# Patient Record
Sex: Female | Born: 1955
Health system: Southern US, Community
[De-identification: ages and names within clinical notes are randomized; demographics above are authoritative.]

## PROBLEM LIST (undated history)

## (undated) DIAGNOSIS — E538 Deficiency of other specified B group vitamins: Secondary | ICD-10-CM

## (undated) DIAGNOSIS — K5790 Diverticulosis of intestine, part unspecified, without perforation or abscess without bleeding: Secondary | ICD-10-CM

## (undated) DIAGNOSIS — H269 Unspecified cataract: Secondary | ICD-10-CM

## (undated) DIAGNOSIS — F419 Anxiety disorder, unspecified: Secondary | ICD-10-CM

## (undated) DIAGNOSIS — C4491 Basal cell carcinoma of skin, unspecified: Secondary | ICD-10-CM

## (undated) DIAGNOSIS — T7840XA Allergy, unspecified, initial encounter: Secondary | ICD-10-CM

## (undated) DIAGNOSIS — R634 Abnormal weight loss: Secondary | ICD-10-CM

## (undated) DIAGNOSIS — K219 Gastro-esophageal reflux disease without esophagitis: Secondary | ICD-10-CM

## (undated) HISTORY — DX: Diverticulosis of intestine, part unspecified, without perforation or abscess without bleeding: K57.90

## (undated) HISTORY — DX: Basal cell carcinoma of skin, unspecified: C44.91

## (undated) HISTORY — DX: Deficiency of other specified B group vitamins: E53.8

## (undated) HISTORY — DX: Gastro-esophageal reflux disease without esophagitis: K21.9

## (undated) HISTORY — PX: MOHS SURGERY: SUR867

## (undated) HISTORY — DX: Anxiety disorder, unspecified: F41.9

## (undated) HISTORY — DX: Abnormal weight loss: R63.4

## (undated) HISTORY — DX: Unspecified cataract: H26.9

## (undated) HISTORY — DX: Allergy, unspecified, initial encounter: T78.40XA

---

## 1998-04-24 ENCOUNTER — Other Ambulatory Visit: Admission: RE | Admit: 1998-04-24 | Discharge: 1998-04-24 | Payer: Self-pay | Admitting: *Deleted

## 1998-04-24 ENCOUNTER — Other Ambulatory Visit: Admission: RE | Admit: 1998-04-24 | Discharge: 1998-04-24 | Payer: Self-pay | Admitting: Obstetrics and Gynecology

## 1999-06-27 ENCOUNTER — Other Ambulatory Visit: Admission: RE | Admit: 1999-06-27 | Discharge: 1999-06-27 | Payer: Self-pay | Admitting: *Deleted

## 1999-06-27 ENCOUNTER — Encounter (INDEPENDENT_AMBULATORY_CARE_PROVIDER_SITE_OTHER): Payer: Self-pay

## 2001-01-13 ENCOUNTER — Other Ambulatory Visit: Admission: RE | Admit: 2001-01-13 | Discharge: 2001-01-13 | Payer: Self-pay | Admitting: *Deleted

## 2002-07-06 ENCOUNTER — Other Ambulatory Visit: Admission: RE | Admit: 2002-07-06 | Discharge: 2002-07-06 | Payer: Self-pay | Admitting: Family Medicine

## 2003-05-17 ENCOUNTER — Encounter: Admission: RE | Admit: 2003-05-17 | Discharge: 2003-05-17 | Payer: Self-pay

## 2003-05-30 ENCOUNTER — Ambulatory Visit: Admission: RE | Admit: 2003-05-30 | Discharge: 2003-05-30 | Payer: Self-pay

## 2004-01-16 ENCOUNTER — Encounter: Admission: RE | Admit: 2004-01-16 | Discharge: 2004-01-16 | Payer: Self-pay | Admitting: Family Medicine

## 2004-01-16 ENCOUNTER — Encounter (INDEPENDENT_AMBULATORY_CARE_PROVIDER_SITE_OTHER): Payer: Self-pay | Admitting: *Deleted

## 2005-04-03 ENCOUNTER — Encounter: Admission: RE | Admit: 2005-04-03 | Discharge: 2005-04-03 | Payer: Self-pay | Admitting: Family Medicine

## 2005-04-16 ENCOUNTER — Other Ambulatory Visit: Admission: RE | Admit: 2005-04-16 | Discharge: 2005-04-16 | Payer: Self-pay | Admitting: Family Medicine

## 2007-03-19 ENCOUNTER — Encounter: Admission: RE | Admit: 2007-03-19 | Discharge: 2007-03-19 | Payer: Self-pay | Admitting: Family Medicine

## 2007-10-27 ENCOUNTER — Encounter: Admission: RE | Admit: 2007-10-27 | Discharge: 2007-10-27 | Payer: Self-pay | Admitting: Family Medicine

## 2008-07-17 ENCOUNTER — Ambulatory Visit: Payer: Self-pay | Admitting: Internal Medicine

## 2008-08-14 ENCOUNTER — Ambulatory Visit: Payer: Self-pay | Admitting: Internal Medicine

## 2010-01-07 ENCOUNTER — Encounter (INDEPENDENT_AMBULATORY_CARE_PROVIDER_SITE_OTHER): Payer: Self-pay | Admitting: *Deleted

## 2010-02-06 ENCOUNTER — Encounter: Admission: RE | Admit: 2010-02-06 | Discharge: 2010-02-06 | Payer: Self-pay | Admitting: Obstetrics and Gynecology

## 2010-02-27 ENCOUNTER — Ambulatory Visit: Payer: Self-pay | Admitting: Internal Medicine

## 2010-02-27 DIAGNOSIS — R634 Abnormal weight loss: Secondary | ICD-10-CM

## 2010-02-27 DIAGNOSIS — R1012 Left upper quadrant pain: Secondary | ICD-10-CM | POA: Insufficient documentation

## 2010-02-27 HISTORY — DX: Abnormal weight loss: R63.4

## 2010-02-27 HISTORY — DX: Left upper quadrant pain: R10.12

## 2010-02-27 LAB — CONVERTED CEMR LAB
ALT: 16 units/L (ref 0–35)
AST: 17 units/L (ref 0–37)
BUN: 15 mg/dL (ref 6–23)
CO2: 29 meq/L (ref 19–32)
Chloride: 106 meq/L (ref 96–112)
GFR calc non Af Amer: 133.82 mL/min (ref >60)
Glucose, Bld: 81 mg/dL (ref 70–99)
Vit D, 25-Hydroxy: 21 ng/mL — ABNORMAL LOW (ref 30–89)

## 2010-02-28 LAB — CONVERTED CEMR LAB: IgA: 193 mg/dL (ref 68–378)

## 2010-03-03 ENCOUNTER — Ambulatory Visit: Payer: Self-pay | Admitting: Internal Medicine

## 2010-03-03 DIAGNOSIS — E538 Deficiency of other specified B group vitamins: Secondary | ICD-10-CM | POA: Insufficient documentation

## 2010-04-01 ENCOUNTER — Ambulatory Visit: Payer: Self-pay | Admitting: Internal Medicine

## 2010-04-29 ENCOUNTER — Ambulatory Visit: Payer: Self-pay | Admitting: Internal Medicine

## 2010-05-27 ENCOUNTER — Ambulatory Visit: Payer: Self-pay | Admitting: Internal Medicine

## 2010-05-29 ENCOUNTER — Ambulatory Visit: Payer: Self-pay | Admitting: Family Medicine

## 2010-05-29 ENCOUNTER — Telehealth: Payer: Self-pay | Admitting: Family Medicine

## 2010-05-29 DIAGNOSIS — R1031 Right lower quadrant pain: Secondary | ICD-10-CM

## 2010-05-29 DIAGNOSIS — R079 Chest pain, unspecified: Secondary | ICD-10-CM | POA: Insufficient documentation

## 2010-05-29 HISTORY — DX: Chest pain, unspecified: R07.9

## 2010-05-29 HISTORY — DX: Right lower quadrant pain: R10.31

## 2010-06-27 ENCOUNTER — Ambulatory Visit: Payer: Self-pay | Admitting: Internal Medicine

## 2010-07-24 ENCOUNTER — Ambulatory Visit: Payer: Self-pay | Admitting: Internal Medicine

## 2010-07-24 DIAGNOSIS — E559 Vitamin D deficiency, unspecified: Secondary | ICD-10-CM | POA: Insufficient documentation

## 2010-08-20 ENCOUNTER — Ambulatory Visit: Payer: Self-pay | Admitting: Internal Medicine

## 2010-08-28 ENCOUNTER — Ambulatory Visit: Payer: Self-pay | Admitting: Internal Medicine

## 2010-09-01 ENCOUNTER — Ambulatory Visit: Payer: Self-pay | Admitting: Internal Medicine

## 2010-09-17 ENCOUNTER — Ambulatory Visit: Payer: Self-pay | Admitting: Internal Medicine

## 2010-09-19 ENCOUNTER — Telehealth: Payer: Self-pay | Admitting: Internal Medicine

## 2010-10-10 ENCOUNTER — Ambulatory Visit: Admit: 2010-10-10 | Payer: Self-pay | Admitting: Internal Medicine

## 2010-10-19 ENCOUNTER — Encounter: Payer: Self-pay | Admitting: Obstetrics and Gynecology

## 2010-10-26 LAB — CONVERTED CEMR LAB
ALT: 17 units/L (ref 0–35)
Alkaline Phosphatase: 37 units/L — ABNORMAL LOW (ref 39–117)
Basophils Relative: 0.8 % (ref 0.0–3.0)
Bilirubin, Direct: 0.1 mg/dL (ref 0.0–0.3)
Blood in Urine, dipstick: NEGATIVE
CO2: 29 meq/L (ref 19–32)
Chloride: 106 meq/L (ref 96–112)
Eosinophils Absolute: 0 10*3/uL (ref 0.0–0.7)
Eosinophils Relative: 1.2 % (ref 0.0–5.0)
Glucose, Urine, Semiquant: NEGATIVE
HDL: 84.1 mg/dL (ref >39.00)
Hemoglobin: 13.9 g/dL (ref 12.0–15.0)
Ketones, urine, test strip: NEGATIVE
Lymphs Abs: 1.5 10*3/uL (ref 0.7–4.0)
MCHC: 34.9 g/dL (ref 30.0–36.0)
Monocytes Relative: 7.3 % (ref 3.0–12.0)
Neutro Abs: 2.2 10*3/uL (ref 1.4–7.7)
RBC: 4.09 M/uL (ref 3.87–5.11)
RDW: 12.9 % (ref 11.5–14.6)
Sodium: 142 meq/L (ref 135–145)
Specific Gravity, Urine: <1.005
Total Bilirubin: 0.8 mg/dL (ref 0.3–1.2)
Total Protein: 6.4 g/dL (ref 6.0–8.3)
VLDL: 5 mg/dL (ref 0.0–40.0)

## 2010-10-28 NOTE — Assessment & Plan Note (Signed)
Summary: #2 of 6 monthly B12 inj/dfs  Nurse Visit   Allergies: No Known Drug Allergies  Medication Administration  Injection # 1:    Medication: Vit B12 1000 mcg    Diagnosis: B12 DEFICIENCY (ICD-266.2)    Route: IM    Site: R deltoid    Exp Date: 4/13    Lot #: [id number]    Mfr: American Regent    Patient tolerated injection without complications    Given by: Milford Cage NCMA (April 01, 2010 3:38 PM)  Orders Added: 1)  Vit B12 1000 mcg [J3420]

## 2010-10-28 NOTE — Miscellaneous (Signed)
Summary: Vitamin D Levels     ---- 07/24/2010 12:07 PM, Karen Kitchens Nelson-Smith CMA (AAMA) wrote: This Patient requests refills on Vitamin D..Marland KitchenMarland KitchenWe gave her an prescription in June but her vitamin d level was normal on 05/29/10....would you like me to continue giving her refills?   ---- 07/24/2010 12:18 PM, Hart Carwin MD wrote: it is still on the low side, so I would give her another course of Vit D, then recheck levels again.  Patient has been entered into [facility] system to have repeat labs around 10/10/10. Patient has been advised. Dottie Nelson-Smith CMA Duncan Dull)  July 24, 2010 2:09 PM   Problems: Added new problem of VITAMIN D DEFICIENCY (ICD-268.9) Medications: Added new medication of VITAMIN D (ERGOCALCIFEROL) 50000 UNIT CAPS (ERGOCALCIFEROL) Take 1 capsule by mouth once per week x 12 weeks, then have repeat labs completed. - Signed Rx of VITAMIN D (ERGOCALCIFEROL) 50000 UNIT CAPS (ERGOCALCIFEROL) Take 1 capsule by mouth once per week x 12 weeks, then have repeat labs completed.;  #12 x 0;  Signed;  Entered by: Lamona Curl CMA (AAMA);  Authorized by: Hart Carwin MD;  Method used: Faxed to [facility], [address] [place], [place], [place]  [address], Ph: [phone], Fax: [phone]    Prescriptions: VITAMIN D (ERGOCALCIFEROL) 50000 UNIT CAPS (ERGOCALCIFEROL) Take 1 capsule by mouth once per week x 12 weeks, then have repeat labs completed.  #12 x 0   Entered by:   Lamona Curl CMA (AAMA)   Authorized by:   Hart Carwin MD   Signed by:   Lamona Curl CMA (AAMA) on 07/24/2010   Method used:   Faxed to ...       [facility] (retail)       [phone]       [place], [place]  [address]       Ph: [phone]       Fax: [phone]   RxID:   [id number]

## 2010-10-28 NOTE — Progress Notes (Signed)
Summary: REFERRAL  ---- Converted from flag ---- ---- 05/29/2010 2:40 PM, Neena Rhymes MD wrote: treadmill only.  thanks!  ---- 05/29/2010 2:29 PM, Magdalen Spatz [facility] wrote: Dr. Beverely Low,  Just want to confirm which Treadmill you want, treadmill only, or Nuclear/Cardiolite Stress Test w/treadmill?  Thank you ------------------------------

## 2010-10-28 NOTE — Assessment & Plan Note (Signed)
Summary: NEW TO EST/CPX//PH   Vital Signs:  Patient profile:   55 year old female Height:      66 inches Weight:      114 pounds BMI:     18.47 Pulse rate:   66 / minute BP sitting:   100 / 60  (left arm)  Vitals Entered By: Doristine Devoid CMA (May 29, 2010 9:59 AM) CC: new est- CPX AND LABS   History of Present Illness: 55 yo woman here today to establish care.  previous MD- Riley Nearing.  GI- Brodie  GYN- Dickstein, UTD on pap and mammo.  Neuro- Sethi  chest pain- occured yesterday while walking, 'it stopped me in my tracks'.  had associated SOB.  sxs lasted < 1 minute.  has family hx of CAD.  has worn holter monitor in the past- showed 'abnormal rhythm' but pt isn't sure what it was called.  no CP or SOB today, after sxs resolved yesterday pt felt 'fine' the rest of the day.  'it felt like i needed to burp but couldn't'  Preventive Screening-Counseling & Management  Alcohol-Tobacco     Alcohol drinks/day: <1     Alcohol type: wine     Smoking Status: quit  Caffeine-Diet-Exercise     Does Patient Exercise: yes     Type of exercise: walking, yoga      Sexual History:  currently monogamous.        Drug Use:  never.    Current Medications (verified): 1)  Evamist 1.53 Mg/spray Soln (Estradiol) .Marland Kitchen.. 1 Spray Per Day 2)  Bentyl 10 Mg Caps (Dicyclomine Hcl) .... Take 1 Tablet By Mouth Two Times A Day  Allergies (verified): No Known Drug Allergies  Past History:  Past Medical History: Diverticulosis anxiety- hx of panic attack hx of GERD seasonal allergies basal cell skin cancer- Marylene Buerger  Past Surgical History: Moh's surgery  Family History: Family History of Stomach  aunt grandmother Family History of Colitis/Crohn's:mother Family History of Inflammatory Bowel Disease:mother Family History of Kidney Disease: Cancer Mother Lung Cancer- Dad CAD- mother, paternal grandfather Breast CA- none  Social History: smoked in college  Alcohol Use - yes 3 per week    Daily Caffeine Use2 cups day Illicit Drug Use - no Exercises regularly married, no children works as a Public relations account executive Smoking Status:  quit Does Patient Exercise:  yes Drug Use:  never Sexual History:  currently monogamous  Review of Systems       The patient complains of chest pain.  The patient denies anorexia, fever, weight loss, weight gain, vision loss, decreased hearing, hoarseness, syncope, dyspnea on exertion, peripheral edema, prolonged cough, headaches, abdominal pain, melena, hematochezia, severe indigestion/heartburn, hematuria, suspicious skin lesions, depression, abnormal bleeding, enlarged lymph nodes, and breast masses.         chest pain- see HPI hx of GERD- controlled per pt  Physical Exam  General:  Well developed, well nourished, no acute distress. Head:  Normocephalic and atraumatic without obvious abnormalities. No apparent alopecia or balding. Eyes:  No corneal or conjunctival inflammation noted. EOMI. Perrla. Funduscopic exam benign, without hemorrhages, exudates or papilledema. Vision grossly normal. Ears:  External ear exam shows no significant lesions or deformities.  Otoscopic examination reveals clear canals, tympanic membranes are intact bilaterally without bulging, retraction, inflammation or discharge. Hearing is grossly normal bilaterally. Nose:  External nasal examination shows no deformity or inflammation. Nasal mucosa are pink and moist without lesions or exudates. Mouth:  Oral mucosa and oropharynx without  lesions or exudates.  Teeth in good repair. Neck:  No deformities, masses, or tenderness noted. Breasts:  deferred to GYN Lungs:  Normal respiratory effort, chest expands symmetrically. Lungs are clear to auscultation, no crackles or wheezes. Heart:  Normal rate and regular rhythm. S1 and S2 normal without gallop, murmur, click, rub or other extra sounds. Abdomen:  soft, mild TTP in RLQ, no rebound/guarding Genitalia:  deferred to  gyn Pulses:  +2 carotid, radial, DP Extremities:  No clubbing, cyanosis, edema, or deformity noted with normal full range of motion of all joints.   Neurologic:  No cranial nerve deficits noted. Station and gait are normal. Plantar reflexes are down-going bilaterally. DTRs are symmetrical throughout. Sensory, motor and coordinative functions appear intact. Skin:  Intact without suspicious lesions or rashes Cervical Nodes:  No lymphadenopathy noted Axillary Nodes:  No palpable lymphadenopathy Psych:  Cognition and judgment appear intact. Alert and cooperative with normal attention span and concentration. No apparent delusions, illusions, hallucinations   Impression & Recommendations:  Problem # 1:  PHYSICAL EXAMINATION (ICD-V70.0) Assessment New pt's PE WNL.  UTD on health maintainence via multiple specialists.  check labs.  anticipatory guidance provided. Orders: T-Vitamin D (25-Hydroxy) [phone]) Specimen Handling ([id number]) UA Dipstick w/o Micro (manual) (81002)  Problem # 2:  CHEST PAIN (ICD-786.50) Assessment: New pt's episode of chest pain completely resolved, EKG w/out acute abnormality.  check labs.  get stress test to risk stratify and further evaluate.  differential dx includes cardiac, GERD, anxiety, exercise induced asthma.  reviewed supportive care and red flags that should prompt return.  Pt expresses understanding and is in agreement w/ this plan. Orders: Venipuncture (03474) Specimen Handling (25956) EKG w/ Interpretation (93000) Cardiology Referral (Cardiology) TLB-Lipid Panel (80061-LIPID) TLB-Hepatic/Liver Function Pnl (80076-HEPATIC) TLB-BMP (Basic Metabolic Panel-BMET) (80048-METABOL) TLB-TSH (Thyroid Stimulating Hormone) (84443-TSH) TLB-CBC Platelet - w/Differential (85025-CBCD)  Problem # 3:  ABDOMINAL PAIN, RIGHT LOWER QUADRANT (ICD-789.03) Assessment: New pt w/ mild TTP over RLQ.  no rebound or guarding.  pt not ill appearing, afebrile, vitals stable.  has  GI doctor.  reviewed supportive care and red flags that should prompt return.  Pt expresses understanding and is in agreement w/ this plan.  Complete Medication List: 1)  Evamist 1.53 Mg/spray Soln (Estradiol) .Marland Kitchen.. 1 spray per day 2)  Bentyl 10 Mg Caps (Dicyclomine hcl) .... Take 1 tablet by mouth two times a day 3)  Alprazolam 0.25 Mg Tabs (Alprazolam) .Marland Kitchen.. 1 tab by mouth as needed for anxiety 4)  Loratadine 10 Mg Tabs (Loratadine) .... Once daily as needed allergies 5)  Nasonex 50 Mcg/act Susp (Mometasone furoate) .... 2 sprays each nostril once daily 6)  Progesterone  .... As directed by gyn  Patient Instructions: 1)  Someone will call you with the appt for your stress test 2)  Please have your specialists send me copies of their notes 3)  We'll notify you of your lab results 4)  Call with any questions or concerns 5)  Welcome!  We're glad to have you! Prescriptions: LORATADINE 10 MG  TABS (LORATADINE) once daily as needed allergies  #30 x 12   Entered and Authorized by:   Neena Rhymes MD   Signed by:   Neena Rhymes MD on 05/29/2010   Method used:   Historical   RxID:   [id number] ALPRAZOLAM 0.25 MG TABS (ALPRAZOLAM) 1 tab by mouth as needed for anxiety  #30 x 1   Entered and Authorized by:   Neena Rhymes MD   Signed by:  Neena Rhymes MD on 05/29/2010   Method used:   Print then Give to Patient   RxID:   [id number]     Laboratory Results   Urine Tests   Date/Time Reported: May 29, 2010 11:04 AM   Routine Urinalysis   Color: yellow Appearance: Clear Glucose: negative   (Normal Range: Negative) Bilirubin: negative   (Normal Range: Negative) Ketone: negative   (Normal Range: Negative) Spec. Gravity: <1.005   (Normal Range: 1.003-1.035) Blood: negative   (Normal Range: Negative) pH: 5.0   (Normal Range: 5.0-8.0) Protein: negative   (Normal Range: Negative) Urobilinogen: negative   (Normal Range: 0-1) Nitrite: negative   (Normal  Range: Negative) Leukocyte Esterace: negative   (Normal Range: Negative)    Comments: Floydene Flock  May 29, 2010 11:05 AM

## 2010-10-28 NOTE — Assessment & Plan Note (Signed)
Summary: #1 OF 6.  Q MONTH x6, THEN RECHECK LABS.   [facility]  Nurse Visit   Allergies: No Known Drug Allergies  Medication Administration  Injection # 1:    Medication: Vit B12 1000 mcg    Diagnosis: B12 DEFICIENCY (ICD-266.2)    Route: IM    Site: L deltoid    Exp Date: 4/13    Lot #: [id number]    Mfr: [employer]ar    Patient tolerated injection without complications    Given by: Ashok Cordia RN (March 03, 2010 9:38 AM)  Orders Added: 1)  Vit B12 1000 mcg [J3420]

## 2010-10-28 NOTE — Assessment & Plan Note (Signed)
Summary: MONTHLY B12 SHOT/JMS  Nurse Visit   Medication Administration  Injection # 1:    Medication: Vit B12 1000 mcg    Diagnosis: B12 DEFICIENCY (ICD-266.2)    Route: IM    Site: R deltoid    Exp Date: 04/2012    Lot #: [id number]    Mfr: [employer]    Comments: PT WILL RETURN ON 09/17/10 FOR NEXT INJECTION    Patient tolerated injection without complications    Given by: Francee Piccolo CMA Duncan Dull) (August 20, 2010 10:22 AM)  Orders Added: 1)  Vit B12 1000 mcg [J3420]

## 2010-10-28 NOTE — Assessment & Plan Note (Signed)
Summary: F/U APPT...LSW.   History of Present Illness Visit Type: Follow-up Visit Primary GI MD: Lina Sar MD Primary [place]: Neena Rhymes MD Requesting Tahjanae Blankenburg: na Chief Complaint: Pt states one month ago had left side abd pain but she is feeling better now. Pt denies any other GI complaints  History of Present Illness:   This is a 55 year old white female with left upper quadrant abdominal discomfort. It bothered her last month for several weeks at a time and woke her up at night. It is a burning, constant sensation. The pain did not radiate to her back. She takes occasional anti-inflammatory agents. We have seen her in the past for abdominal pain and irritable bowel syndrome as well as for possible diverticulitis. Her mother has ulcerative colitis. Her tissue transglutaminase and stool for fat were negative. Her upper abdominal ultrasound in April 2005 showed a small liver cyst measuring 1.8, 1.5 and 1.2 cm. I saw her last year in June 2011. I suggested an upper endoscopy but  patient wanted to wait.   GI Review of Systems    Reports abdominal pain.     Location of  Abdominal pain: left side.    Denies acid reflux, belching, bloating, chest pain, dysphagia with liquids, dysphagia with solids, heartburn, loss of appetite, nausea, vomiting, vomiting blood, weight loss, and  weight gain.        Denies anal fissure, black tarry stools, change in bowel habit, constipation, diarrhea, diverticulosis, fecal incontinence, heme positive stool, hemorrhoids, irritable bowel syndrome, jaundice, light color stool, liver problems, rectal bleeding, and  rectal pain.    Current Medications (verified): 1)  Evamist 1.53 Mg/spray Soln (Estradiol) .Marland Kitchen.. 1 Spray Per Day 2)  Bentyl 10 Mg Caps (Dicyclomine Hcl) .... Take 1 Tablet By Mouth Two Times A Day As Needed 3)  Alprazolam 0.25 Mg Tabs (Alprazolam) .Marland Kitchen.. 1 Tab By Mouth As Needed For Anxiety 4)  Loratadine 10 Mg  Tabs (Loratadine) .... Once Daily As  Needed Allergies 5)  Nasonex 50 Mcg/act Susp (Mometasone Furoate) .... 2 Sprays Each Nostril Once Daily 6)  Progesterone .... As Directed By Gyn 7)  Vitamin D (Ergocalciferol) 50000 Unit Caps (Ergocalciferol) .... Take 1 Capsule By Mouth Once Per Week X 12 Weeks, Then Have Repeat Labs Completed.  Allergies (verified): No Known Drug Allergies  Past History:  Past Medical History: Diverticulosis anxiety- hx of panic attack hx of GERD seasonal allergies basal cell skin cancer- Marylene Buerger VITAMIN D DEFICIENCY (ICD-268.9) CHEST PAIN (ICD-786.50) ABDOMINAL PAIN, RIGHT LOWER QUADRANT (ICD-789.03) PHYSICAL EXAMINATION (ICD-V70.0) B12 DEFICIENCY (ICD-266.2) LOSS OF WEIGHT (ICD-783.21) ABDOMINAL PAIN, LEFT UPPER QUADRANT (ICD-789.02)  Past Surgical History: Reviewed history from 05/29/2010 and no changes required. Moh's surgery  Family History: Family History of Stomach  aunt grandmother Family History of Colitis/Crohn's:mother Family History of Inflammatory Bowel Disease:mother Family History of Kidney Disease: Cancer Mother Lung Cancer- Dad CAD- mother, paternal grandfather Breast CA- none No FH of Colon Cancer:  Social History: Reviewed history from 05/29/2010 and no changes required. 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  Review of Systems  The patient denies allergy/sinus, anemia, anxiety-new, arthritis/joint pain, back pain, blood in urine, breast changes/lumps, change in vision, confusion, cough, coughing up blood, depression-new, fainting, fatigue, fever, headaches-new, hearing problems, heart murmur, heart rhythm changes, itching, menstrual pain, muscle pains/cramps, night sweats, nosebleeds, pregnancy symptoms, shortness of  breath, skin rash, sleeping problems, sore throat, swelling of feet/legs, swollen lymph glands, thirst -  excessive , urination - excessive , urination changes/pain, urine leakage, vision changes, and voice change.         Pertinent positive and negative review of systems were noted in the above HPI. All other ROS was otherwise negative.   Vital Signs:  Patient profile:   55 year old female Height:      66 inches Weight:      118 pounds BMI:     19.11 BSA:     1.60 Pulse rate:   60 / minute Pulse rhythm:   regular BP sitting:   110 / 64  (left arm) Cuff size:   regular  Vitals Entered By: Ok Anis CMA (September 01, 2010 9:58 AM)  Physical Exam  General:  Well developed, well nourished, no acute distress. Eyes:  PERRLA, no icterus. Chest Wall:  mild tenderness of the left costal margin when pressing on the last 2 ribs. Lungs:  Clear throughout to auscultation. Heart:  Regular rate and rhythm; no murmurs, rubs,  or bruits. Abdomen:  soft relaxed abdomen with normoactive bowel sounds. No distention. Liver edge at costal margin. No scars. Mild discomfort in left upper quadrant. Extremities:  No clubbing, cyanosis, edema or deformities noted. Skin:  Intact without significant lesions or rashes. Psych:  Alert and cooperative. Normal mood and affect.   Impression & Recommendations:  Problem # 1:  VITAMIN D DEFICIENCY (ICD-268.9) We will recheck vitamin D levels today.  Orders: T-Vitamin D (25-Hydroxy) [phone]) TLB-B12, Serum-Total ONLY (08657-Q46) TLB-H. Pylori Abs(Helicobacter Pylori) (86677-HELICO)  Problem # 2:  B12 DEFICIENCY (ICD-266.2) We will recheck patient's vitamin B12 levels. if low, we will have to r/o PA on EGD.  Orders: T-Vitamin D (25-Hydroxy) [id number]) TLB-B12, Serum-Total ONLY (24401-U27) TLB-H. Pylori Abs(Helicobacter Pylori) (86677-HELICO)  Problem # 3:  LOSS OF WEIGHT (ICD-783.21) Patient's weight has stabilized. She has actually gained 4 pounds since last June.  Orders: T-Vitamin D (25-Hydroxy) [phone]) TLB-B12, Serum-Total ONLY  (74259-D63) TLB-H. Pylori Abs(Helicobacter Pylori) (86677-HELICO)  Problem # 4:  ABDOMINAL PAIN, LEFT UPPER QUADRANT (ICD-789.02) We need to rule out gastritis, H. pyloric gastropathy or NSAID related gastropathy. We will check an H. pylori antibody today. Patient will purchase Pepcid AC 20 mg one a day for 10 days. We will schedule her for an upper endoscopy. She wants to wait until January 2012.  Patient Instructions: 1)  We have given you several dates on which you can schedule your endoscopy. Please call our office and schedule as soon as you discuss with your husband. 2)  Take Pepcid 40 mg daily for 10 days. 3)  Your physician requests that you go to the basement floor of our office to have the following labwork completed before leaving today: Vitamin B12 level, Vitamin D level, H Pylori antibodies 4)  Copy sent to : Neena Rhymes, MD 5)  The medication list was reviewed and reconciled.  All changed / newly prescribed medications were explained.  A complete medication list was provided to the patient / caregiver.

## 2010-10-28 NOTE — Letter (Signed)
Summary: New Patient letter  [facility]  [address] [place], [place] [address]   Phone: [phone]  Fax: [phone]       01/07/2010 MRN: [id number]  [facility] [address] [place], [place]  [address]  Dear Ms. Cahoon,  Welcome to the Gastroenterology Division at [facility].    You are scheduled to see Dr. Lina Sar on February 27, 2010 at 9:15am on the 3rd floor at [facility], [address]  We ask that you try to arrive at our office 15 minutes prior to your appointment time to allow for check-in.  We would like you to complete the enclosed self-administered evaluation form prior to your visit and bring it with you on the day of your appointment.  We will review it with you.  Also, please bring a complete list of all your medications or, if you prefer, bring the medication bottles and we will list them.  Please bring your insurance card so that we may make a copy of it.  If your insurance requires a referral to see a specialist, please bring your referral form from your primary care physician.  Co-payments are due at the time of your visit and may be paid by cash, check or credit card.     Your office visit will consist of a consult with your physician (includes a physical exam), any laboratory testing he/she may order, scheduling of any necessary diagnostic testing (e.g. x-ray, ultrasound, CT-scan), and scheduling of a procedure (e.g. Endoscopy, Colonoscopy) if required.  Please allow enough time on your schedule to allow for any/all of these possibilities.    If you cannot keep your appointment, please call [phone] to cancel or reschedule prior to your appointment date.  This allows [place] the opportunity to schedule an appointment for another patient in need of care.  If you do not cancel or reschedule by 5 p.m. the business day prior to your appointment date, you will be charged a $50.00 late cancellation/no-show fee.    Thank you for choosing  [facility] for your medical needs.  We appreciate the opportunity to care for you.  Please visit [place] at our website  to learn more about our practice.                     Sincerely,                                                             The Gastroenterology Division

## 2010-10-28 NOTE — Assessment & Plan Note (Signed)
Summary: Monthly B12, 266.2  Nurse Visit   Allergies: No Known Drug Allergies  Medication Administration  Injection # 1:    Medication: Vit B12 1000 mcg    Diagnosis: B12 DEFICIENCY (ICD-266.2)    Route: IM    Site: R deltoid    Exp Date: 02/27/2012    Lot #: [id number]    Mfr: [employer]    Patient tolerated injection without complications    Given by: Christie Nottingham CMA (AAMA) (May 27, 2010 10:10 AM)  Orders Added: 1)  Vit B12 1000 mcg [J3420]

## 2010-10-28 NOTE — Assessment & Plan Note (Signed)
Summary: MONTHLY B12 SHOT...LSW.  Nurse Visit   Allergies: No Known Drug Allergies  Medication Administration  Injection # 1:    Medication: Vit B12 1000 mcg    Diagnosis: B12 DEFICIENCY (ICD-266.2)    Route: IM    Site: L deltoid    Exp Date: 03/28/2012    Lot #: [id number]    Mfr: [employer]    Comments: Made pt appt for next monthly B12 on 08-20-2010 @ 10:00 AM.     Patient tolerated injection without complications    Given by: Lowry Ram NCMA (July 25, 2010 8:41 AM)  Orders Added: 1)  Vit B12 1000 mcg [J3420]

## 2010-10-28 NOTE — Assessment & Plan Note (Signed)
Summary: MONTHLY B12 SHOT...LSW.  Nurse Visit   Allergies: No Known Drug Allergies  Medication Administration  Injection # 1:    Medication: Vit B12 1000 mcg    Diagnosis: B12 DEFICIENCY (ICD-266.2)    Route: IM    Site: L deltoid    Exp Date: 01/27/2012    Lot #: [id number]    Mfr: [employer]    Comments: Next Monthly B12 Injection appointment made on 05-27-10 @ 10:00 AM .    Patient tolerated injection without complications    Given by: Lowry Ram NCMA (April 29, 2010 10:09 AM)  Orders Added: 1)  Vit B12 1000 mcg [J3420]

## 2010-10-28 NOTE — Assessment & Plan Note (Signed)
Summary: left side pain, diverticulities...em   History of Present Illness Visit Type: Initial Visit Primary GI MD: Lina Sar MD Primary Provider: Bernadette Hoit MD Chief Complaint: left side abd pain History of Present Illness:   This is a 55 year old white female with complaints of loose stools, weight loss and left upper quadrant  abdominal pain. She had a screening colonoscopy in November 2009 which  showed diverticulosis and questionable diverticulitis. An abdominal ultrasound in May 2005 showed hepatic cysts and a common bile duct measuring 6.41mm. Her main complaint is fatigue and lack of energy. She works full time. Her usual weight is 125 pounds but her current weight is 114 pounds. There has been no nausea, vomiting, history of peptic ulcer disease or family history of colon cancer. Her mother apparently had ulcerative colitis. Her sprue profile was negative. She describes her stools as  soft and floating.   GI Review of Systems    Reports abdominal pain and  bloating.     Location of  Abdominal pain: left side.    Denies acid reflux, belching, chest pain, dysphagia with liquids, dysphagia with solids, heartburn, loss of appetite, nausea, vomiting, vomiting blood, weight loss, and  weight gain.      Reports diverticulosis.     Denies anal fissure, black tarry stools, change in bowel habit, constipation, diarrhea, fecal incontinence, heme positive stool, hemorrhoids, irritable bowel syndrome, jaundice, light color stool, liver problems, rectal bleeding, and  rectal pain.    Current Medications (verified): 1)  Evamist 1.53 Mg/spray Soln (Estradiol) .Marland Kitchen.. 1 Spray Per Day  Allergies (verified): No Known Drug Allergies  Past History:  Past Medical History: Diverticulosis  Past Surgical History: Unremarkable  Family History: Family History of Stomach  aunt grandmother Family History of Colitis/Crohn's:mother Family History of Inflammatory Bowel Disease:mother Family History  of Kidney Disease: Cancer Mother  Social History: Patient has never smoked.  Alcohol Use - yes 1 per week  Daily Caffeine Use2 cups day Illicit Drug Use - no Patient does not get regular exercise.  Smoking Status:  never Drug Use:  no Does Patient Exercise:  no  Review of Systems       The patient complains of allergy/sinus, change in vision, fatigue, heart rhythm changes, sleeping problems, and urine leakage.  The patient denies anemia, anxiety-new, arthritis/joint pain, back pain, blood in urine, breast changes/lumps, confusion, cough, coughing up blood, depression-new, fainting, fever, headaches-new, hearing problems, heart murmur, itching, menstrual pain, muscle pains/cramps, night sweats, nosebleeds, pregnancy symptoms, shortness of breath, skin rash, sore throat, swelling of feet/legs, swollen lymph glands, thirst - excessive , urination - excessive , urination changes/pain, vision changes, and voice change.         Pertinent positive and negative review of systems were noted in the above HPI. All other ROS was otherwise negative.   Vital Signs:  Patient profile:   55 year old female Height:      66 inches Weight:      114 pounds BMI:     18.47 Pulse rate:   70 / minute Pulse rhythm:   regular BP sitting:   100 / 60  (left arm)  Vitals Entered By: Chales Abrahams CMA Duncan Dull) (February 27, 2010 9:32 AM)  Physical Exam  General:  Well developed, well nourished, no acute distress. Eyes:  PERRLA, no icterus. Mouth:  No deformity or lesions, dentition normal. Neck:  Supple; no masses or thyromegaly. Lungs:  Clear throughout to auscultation. Heart:  Regular rate and rhythm; no  murmurs, rubs,  or bruits. Abdomen:  sabdomen with normoactive bowel sounds. No palpable mass no distention. There was a minimal tenderness in the right lower quadrant. Liver edge was at costal margin. Rectal:  soft Hemoccult negative stool. Extremities:  No clubbing, cyanosis, edema or deformities noted. Skin:   no rash. No stigmata of chronic liver disease. Inguinal Nodes:  no inguinal adenopathy. Psych:  Alert and cooperative. Normal mood and affect.   Impression & Recommendations:  Problem # 1:  LOSS OF WEIGHT (ICD-783.21) Patient has nonspecific weight loss which could be related to dietary restrictions. The patient went on a limited diet. She is perimenopausal and this may be also contributing to her fatigue. We need to evaluate her for steatorrhea with [place] stain., TSH level, serum carotene and IgA levels. We will also obtain iron studies and a B12 level, serum albumin. I doubt that she has malabsorption but we need to go ahead and evaluate that. She will also need a repeat upper abdominal ultrasound and most likely upper endoscopy with small bowel biopsies. She requested to wait with that. Orders: T-Vitamin D (25-Hydroxy) [id number]) T-Stool Fats [place] Stain [phone]) T-Beta Carotene [id number]) TLB-CMP (Comprehensive Metabolic Pnl) (80053-COMP) TLB-TSH (Thyroid Stimulating Hormone) (84443-TSH) TLB-IBC Pnl (Iron/FE;Transferrin) (83550-IBC) TLB-IgA (Immunoglobulin A) (82784-IGA) TLB-Sedimentation Rate (ESR) (85652-ESR)  Problem # 2:  ABDOMINAL PAIN, LEFT UPPER QUADRANT (ICD-789.02) Patient's left upper quadrant discomfort could be due to irritable bowel syndrome. We need to rule out gastritis or a functional GI problem. We will try her on Bentyl 20 mg p.o. b.i.d. Orders: T-Vitamin D (25-Hydroxy) [phone]) T-Stool Fats [place] Stain [id number]) T-Beta Carotene [phone]) TLB-CMP (Comprehensive Metabolic Pnl) (80053-COMP) TLB-TSH (Thyroid Stimulating Hormone) (84443-TSH) TLB-IBC Pnl (Iron/FE;Transferrin) (83550-IBC) TLB-IgA (Immunoglobulin A) (82784-IGA) TLB-Sedimentation Rate (ESR) (85652-ESR)  Other Orders: TLB-B12, Serum-Total ONLY ([id number])  Patient Instructions: 1)  Please go to the basment level for labwork today before leaving (stool for [place] stain, carotene,  TSH, IgA, Sedimentation rate, B12 level, IBC Panel, Vitamin D). 2)  Please pick up Bentyl at your pharmacy. You are to take 1 tablet twice daily. 3)  Copy sent to : Bernadette Hoit MD 4)  The medication list was reviewed and reconciled.  All changed / newly prescribed medications were explained.  A complete medication list was provided to the patient / caregiver. Prescriptions: BENTYL 10 MG CAPS (DICYCLOMINE HCL) Take 1 tablet by mouth two times a day  #60 x 2   Entered by:   Lamona Curl CMA (AAMA)   Authorized by:   Hart Carwin MD   Signed by:   Lamona Curl CMA (AAMA) on 02/27/2010   Method used:   Faxed to ...       [facility] (retail)       [phone] [address]       [place], [place]  [address]       Ph: [phone]       Fax: [phone]   RxID:   [id number]

## 2010-10-28 NOTE — Assessment & Plan Note (Signed)
Summary: MONTHLY B12 SHOT..AM  Nurse Visit   Allergies: No Known Drug Allergies  Medication Administration  Injection # 1:    Medication: Vit B12 1000 mcg    Diagnosis: B12 DEFICIENCY (ICD-266.2)    Route: IM    Site: L deltoid    Exp Date: 03/2012    Lot #: [id number]    Mfr: [employer]    Patient tolerated injection without complications    Given by: Harlow Mares CMA (AAMA) (June 27, 2010 10:15 AM)  Orders Added: 1)  Vit B12 1000 mcg [J3420]

## 2010-10-30 NOTE — Progress Notes (Signed)
Summary: No longer needs B12  ---- Converted from flag ----  ---- 09/17/2010 10:24 AM, Karen Kitchens Nelson-Smith CMA (AAMA) wrote: This patient came for b12 injection today. I want to make sure you would like her to continue b12 injections. Her last level was 532 on 09/01/10. Also, just an FYI, I did ask patient about endoscopy since she has not scheduled yet as recommended at her office visit. She states that she and her husband are still discussing whether or not she can go thru with it..... ------------------------------ ---- 09/17/2010 10:08 PM, Hart Carwin MD wrote: No further B12 injections needed. OK not to go ahead with EGD, but her abd. pain may go unexplained. It is her choice.   Phone Note Outgoing Call   Call placed by: Lamona Curl CMA Duncan Dull),  September 19, 2010 8:36 AM Call placed to: Patient Summary of Call: I have called to make patient aware that per Dr Juanda Chance, she no longer needs b12 injections at this time. Patient verbalizes understanding and I have cancelled her b12 appointment for 10/15/10. Initial call taken by: Lamona Curl CMA Duncan Dull),  September 19, 2010 8:37 AM

## 2010-10-30 NOTE — Assessment & Plan Note (Signed)
Summary: MONTHLY B12//266.2//SP  Nurse Visit   Allergies: No Known Drug Allergies  Medication Administration  Injection # 1:    Medication: Vit B12 1000 mcg    Diagnosis: B12 DEFICIENCY (ICD-266.2)    Route: IM    Site: L deltoid    Exp Date: 10/13    Lot #: [id number]    Mfr: [employer]    Patient tolerated injection without complications    Given by: Lamona Curl CMA (AAMA) (September 17, 2010 10:21 AM)  Orders Added: 1)  Vit B12 1000 mcg [J3420]  I have discussed with the patient the fact that she has not scheduled endoscopy as per Dr Regino Schultze request. Patient states that she and her husband are still discussing the matter. I have given her the number to billing as she has specific questions as to how much procedure will cost etc. Dottie Nelson-Smith CMA Duncan Dull)  September 17, 2010 10:22 AM

## 2010-12-12 ENCOUNTER — Ambulatory Visit (INDEPENDENT_AMBULATORY_CARE_PROVIDER_SITE_OTHER): Payer: 59 | Admitting: Family Medicine

## 2010-12-12 ENCOUNTER — Telehealth: Payer: Self-pay | Admitting: Family Medicine

## 2010-12-12 ENCOUNTER — Encounter: Payer: Self-pay | Admitting: Family Medicine

## 2010-12-12 DIAGNOSIS — K5289 Other specified noninfective gastroenteritis and colitis: Secondary | ICD-10-CM | POA: Insufficient documentation

## 2010-12-15 ENCOUNTER — Telehealth: Payer: Self-pay | Admitting: Family Medicine

## 2010-12-16 NOTE — Assessment & Plan Note (Signed)
Summary: vomiting,difficulty swallowing, diarrhea, refused ED//fd   Vital Signs:  Patient profile:   55 year old female Height:      66 inches (167.64 cm) Weight:      115.25 pounds (52.39 kg) BMI:     18.67 Temp:     98.0 degrees F (36.67 degrees C) oral BP sitting:   110 / 66  (right arm) Cuff size:   regular  Vitals Entered By: Lucious Groves CMA (December 12, 2010 9:56 AM) CC: Possible GI bug./kb Is Patient Diabetic? No Pain Assessment Patient in pain? no      Comments Patient notes that since last night she has been having chills, night sweats (but hasn't taken temperature), vomiting, diarrhea, dysphagia, and slight abd pain. Patient denies having any pain at this time.   History of Present Illness: 55 yo woman here today for GI virus.  reports she feels like her 'throat is closing'.  after each episode of vomiting 'it gets worse and worse'.  throat is burning, having difficulty swallowing.  no trouble breathing or speaking.  vomiting and diarrhea started last night.  + chills and sweats but no documented fever.  no known sick contacts.  no abd pain.  vomited x2, diarrhea x3  Current Medications (verified): 1)  Alprazolam 0.25 Mg Tabs (Alprazolam) .Marland Kitchen.. 1 Tab By Mouth As Needed For Anxiety 2)  Nasonex 50 Mcg/act Susp (Mometasone Furoate) .... 2 Sprays Each Nostril Once Daily  Allergies (verified): No Known Drug Allergies  Review of Systems      See HPI  Physical Exam  General:  Well developed, well nourished, no acute distress. Mouth:  MMM, mild erythema of posterior pharynx but no edema present Lungs:  Normal respiratory effort, chest expands symmetrically. Lungs are clear to auscultation, no crackles or wheezes. Heart:  Normal rate and regular rhythm. S1 and S2 normal without gallop, murmur, click, rub or other extra sounds. Abdomen:  soft, diffuse tenderness, no rebound/guarding, normoactive BS   Impression & Recommendations:  Problem # 1:  GASTROENTERITIS  (ICD-558.9) Assessment New pt's sxs w/ community acquired gastroenteritis.  no evidence of dehydration, no edema of throat.  start phenergan for nausea, immodium for diarrhea, advil for throat pain.  reviewed supportive care and red flags that should prompt return.  Pt expresses understanding and is in agreement w/ this plan.  Complete Medication List: 1)  Alprazolam 0.25 Mg Tabs (Alprazolam) .Marland Kitchen.. 1 tab by mouth as needed for anxiety 2)  Nasonex 50 Mcg/act Susp (Mometasone furoate) .... 2 sprays each nostril once daily 3)  Promethazine Hcl 25 Mg Tabs (Promethazine hcl) .Marland Kitchen.. 1 tab by mouth q6 as needed for nausea  Patient Instructions: 1)  This is most likely a viral illness and should improve w/ time 2)  Make sure you are drinking plenty of fluids!!  This is most important! 3)  Use the Promethazine as needed for nausea- this will make you sleepy 4)  Immodium as needed for the diarrhea- take 2 pills after your next stool and then wait at least 2 hrs before taking another (this will avoid constipation) 5)  Advil for the sore throat- either cold or warm liquids (whichever feels better) 6)  If anything changes or your symptoms worsen- please call! 7)  Hang in there!!! Prescriptions: PROMETHAZINE HCL 25 MG  TABS (PROMETHAZINE HCL) 1 tab by mouth Q6 as needed for nausea  #20 x 0   Entered and Authorized by:   Neena Rhymes MD   Signed by:  Neena Rhymes MD on 12/12/2010   Method used:   Electronically to        [facility] (retail)       [address]       [place], [place]  [address]       Ph: [phone] or [phone]       Fax: [phone]   RxID:   [id number]    Orders Added: 1)  Est. Patient Level III [34742]

## 2010-12-16 NOTE — Progress Notes (Signed)
Summary: Rosita Fire refused ED  Phone Note Call from Patient   Caller: Patient Summary of Call: Pt husband called stating that Pt has been vomiting all night and now throat is swelling  causing difficulty with swallowing. Pt husband also note that Pt has experience some episode of diarrhea. Advise Pt husband ED due to possible dehydration and throat swelling shut. Pt husband then states he does not understand " why if it is a emergency why she can't see her PCP" Pt husband advised that she can but with Pt symptoms we feel that they can be better address in ED. Pt husband still refused. Pt husband then changes Pt symptoms and states Pt is not dehydrated" I can feel her skin and tell that she is not' and her throat is not closing up she just can't swallow good. Pt coming in for OV this am................Marland KitchenFelecia Deloach CMA  December 12, 2010 8:22 AM   Follow-up for Phone Call        noted.  agree w/ initial recommendation of ER but will care for pt when she arrives Follow-up by: Neena Rhymes MD,  December 12, 2010 8:28 AM

## 2010-12-25 NOTE — Progress Notes (Signed)
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  #30, no refills Follow-up by: Neena Rhymes MD,  December 15, 2010 12:03 PM  Additional Follow-up for Phone Call Additional follow up Details #1::        Discuss with patient.........Marland KitchenFelecia Deloach CMA  December 15, 2010 1:28 PM     New/Updated Medications: ZOFRAN 4 MG TABS (ONDANSETRON HCL) 1 by mouth every 8 hours as needed for nausea Prescriptions: ZOFRAN 4 MG TABS (ONDANSETRON HCL) 1 by mouth every 8 hours as needed for nausea  #30 x 0   Entered and Authorized by:   Neena Rhymes MD   Signed by:   Neena Rhymes MD on 12/15/2010   Method used:   Electronically to        [facility] (retail)       [address]       [place], [place]  [address]       Ph: [phone] or [phone]       Fax: [phone]   RxID:   [id number]

## 2011-02-13 ENCOUNTER — Telehealth: Payer: Self-pay | Admitting: Family Medicine

## 2011-02-13 MED ORDER — MOMETASONE FUROATE 50 MCG/ACT NA SUSP: 2.0000 | Freq: Every day | NASAL | Status: DC

## 2011-02-13 NOTE — Telephone Encounter (Signed)
Patient didn't need rx at time of appt needs new rx for nasonex -[facility]

## 2011-02-13 NOTE — Telephone Encounter (Signed)
Refill sent and pt notified. 

## 2011-06-04 ENCOUNTER — Other Ambulatory Visit: Payer: Self-pay | Admitting: Family Medicine

## 2011-06-04 MED ORDER — ALPRAZOLAM 0.25 MG PO TABS: 0.2500 mg | ORAL_TABLET | Freq: Three times a day (TID) | ORAL | Status: DC | PRN

## 2011-06-04 NOTE — Telephone Encounter (Signed)
Ok for #30 but pt needs to schedule CPE.

## 2011-06-04 NOTE — Telephone Encounter (Signed)
DONE

## 2011-06-04 NOTE — Telephone Encounter (Signed)
Last CPX 05/29/10. Last script 05/29/10

## 2011-08-19 ENCOUNTER — Encounter: Payer: Self-pay | Admitting: Family Medicine

## 2011-08-27 ENCOUNTER — Encounter: Payer: Self-pay | Admitting: Family Medicine

## 2011-08-27 ENCOUNTER — Ambulatory Visit (INDEPENDENT_AMBULATORY_CARE_PROVIDER_SITE_OTHER): Payer: 59 | Admitting: Family Medicine

## 2011-08-27 DIAGNOSIS — R200 Anesthesia of skin: Secondary | ICD-10-CM

## 2011-08-27 DIAGNOSIS — E538 Deficiency of other specified B group vitamins: Secondary | ICD-10-CM

## 2011-08-27 DIAGNOSIS — M25519 Pain in unspecified shoulder: Secondary | ICD-10-CM

## 2011-08-27 DIAGNOSIS — R1013 Epigastric pain: Secondary | ICD-10-CM

## 2011-08-27 DIAGNOSIS — R209 Unspecified disturbances of skin sensation: Secondary | ICD-10-CM

## 2011-08-27 DIAGNOSIS — E559 Vitamin D deficiency, unspecified: Secondary | ICD-10-CM

## 2011-08-27 DIAGNOSIS — Z0000 Encounter for general adult medical examination without abnormal findings: Secondary | ICD-10-CM

## 2011-08-27 DIAGNOSIS — R202 Paresthesia of skin: Secondary | ICD-10-CM

## 2011-08-27 HISTORY — DX: Encounter for general adult medical examination without abnormal findings: Z00.00

## 2011-08-27 HISTORY — DX: Epigastric pain: R10.13

## 2011-08-27 HISTORY — DX: Pain in unspecified shoulder: M25.519

## 2011-08-27 HISTORY — DX: Anesthesia of skin: R20.0

## 2011-08-27 LAB — CBC WITH DIFFERENTIAL/PLATELET
Basophils Absolute: 0 10*3/uL (ref 0.0–0.1)
Basophils Relative: 0.5 % (ref 0.0–3.0)
Eosinophils Relative: 1.2 % (ref 0.0–5.0)
HCT: 42.3 % (ref 36.0–46.0)
Lymphocytes Relative: 31.4 % (ref 12.0–46.0)
Neutro Abs: 3.1 10*3/uL (ref 1.4–7.7)
Neutrophils Relative %: 59.3 % (ref 43.0–77.0)
RDW: 13.6 % (ref 11.5–14.6)

## 2011-08-27 LAB — LIPID PANEL: Cholesterol: 216 mg/dL — ABNORMAL HIGH (ref 0–200)

## 2011-08-27 LAB — BASIC METABOLIC PANEL
BUN: 11 mg/dL (ref 6–23)
CO2: 29 mEq/L (ref 19–32)
Chloride: 105 mEq/L (ref 96–112)

## 2011-08-27 LAB — HEPATIC FUNCTION PANEL
AST: 21 U/L (ref 0–37)
Alkaline Phosphatase: 42 U/L (ref 39–117)
Bilirubin, Direct: 0 mg/dL (ref 0.0–0.3)
Total Protein: 7.3 g/dL (ref 6.0–8.3)

## 2011-08-27 LAB — VITAMIN B12: Vitamin B-12: 380 pg/mL (ref 211–911)

## 2011-08-27 LAB — TSH: TSH: 2.61 u[IU]/mL (ref 0.35–5.50)

## 2011-08-27 LAB — AMYLASE: Amylase: 56 U/L (ref 27–131)

## 2011-08-27 MED ORDER — NAPROXEN 500 MG PO TABS: 500.0000 mg | ORAL_TABLET | Freq: Two times a day (BID) | ORAL | Status: AC

## 2011-08-27 NOTE — Progress Notes (Signed)
  Subjective:    Patient ID: Jaclyn Spencer, female    DOB: May 31, 1956, 55 y.o.   MRN: [id number]  HPI CPE- UTD on GYN ([facility]), UTD on colonoscopy 2009.  Epigastric pain- intermittent, sxs started again a few weeks ago, 'it's been pretty intense'.  Some associated nausea, no vomiting.  Some loose stools but no diarrhea.  No obvious relation to food but does worsen as day goes on.  No fevers.  Rare ETOH, 2 cups of tea daily.  L sided numbness- has appt w/ neuro Pearlean Brownie) pending in Jan.  Will occur intermittently.  Has been pain free for awhile and felt better when she was getting B12 shots.  L shoulder pain- pain is 'low level of pain', limited movement at times, 'like it catches'.  Will improve w/ Advil.  Review of Systems Patient reports no vision/ hearing changes, adenopathy,fever, weight change,  persistant/recurrent hoarseness , swallowing issues, chest pain, palpitations, edema, persistant/recurrent cough, hemoptysis, dyspnea (rest/exertional/paroxysmal nocturnal), gastrointestinal bleeding (melena, rectal bleeding), significant heartburn, bowel changes, GU symptoms (dysuria, hematuria, incontinence), Gyn symptoms (abnormal  bleeding, pain),  syncope, focal weakness, memory loss, skin/hair/nail changes, abnormal bruising or bleeding, anxiety, or depression.     Objective:   Physical Exam General Appearance:    Alert, cooperative, no distress, appears stated age  Head:    Normocephalic, without obvious abnormality, atraumatic  Eyes:    PERRL, conjunctiva/corneas clear, EOM's intact, fundi    benign, both eyes  Ears:    Normal TM's and external ear canals, both ears  Nose:   Nares normal, septum midline, mucosa normal, no drainage    or sinus tenderness  Throat:   Lips, mucosa, and tongue normal; teeth and gums normal  Neck:   Supple, symmetrical, trachea midline, no adenopathy;    Thyroid: no enlargement/tenderness/nodules  Back:     Symmetric, no curvature, ROM normal, no CVA tenderness    Lungs:     Clear to auscultation bilaterally, respirations unlabored  Chest Wall:    No tenderness or deformity   Heart:    Regular rate and rhythm, S1 and S2 normal, no murmur, rub   or gallop  Breast Exam:    Deferred to GYN  Abdomen:     Soft, mild tenderness over epigastrum, bowel sounds active all four quadrants,    no masses, no organomegaly  Genitalia:    Deferred to GYN  Rectal:    Extremities:   Extremities normal, atraumatic, no cyanosis or edema, L shoulder pain w/ internal rotation and impingement tests  Pulses:   2+ and symmetric all extremities  Skin:   Skin color, texture, turgor normal, no rashes or lesions  Lymph nodes:   Cervical, supraclavicular, and axillary nodes normal  Neurologic:   CNII-XII intact, normal strength, sensation and reflexes    throughout          Assessment & Plan:

## 2011-08-27 NOTE — Patient Instructions (Signed)
Follow up in 3 months to recheck abdominal pain, numbness/tingling We'll notify you of your lab results and set up B12 injections if needed Someone will call you with your sports med appt Take the Naproxen for the shoulder pain- take w/ food to avoid upset stomach Ice the shoulder as needed Have Dr Pearlean Brownie send me his results Call with any questions or concerns Happy Holidays!

## 2011-08-28 ENCOUNTER — Encounter: Payer: Self-pay | Admitting: *Deleted

## 2011-08-29 LAB — VITAMIN D 1,25 DIHYDROXY
Vitamin D2 1, 25 (OH)2: 29 pg/mL
Vitamin D3 1, 25 (OH)2: 37 pg/mL

## 2011-08-31 ENCOUNTER — Encounter: Payer: Self-pay | Admitting: *Deleted

## 2011-08-31 ENCOUNTER — Ambulatory Visit: Payer: 59 | Admitting: Family Medicine

## 2011-09-06 NOTE — Assessment & Plan Note (Signed)
Check labs and replete prn. 

## 2011-09-06 NOTE — Assessment & Plan Note (Signed)
Pt's PE WNL w/ exception of epigastric and shoulder pain.  UTD on health maintenance.  Check labs.  Anticipatory guidance provided.

## 2011-09-06 NOTE — Assessment & Plan Note (Signed)
Check labs and determine whether she again needs injxns.

## 2011-09-06 NOTE — Assessment & Plan Note (Signed)
sxs consistent w/ impingement.  Start NSAIDs.  Refer to sports med.

## 2011-09-06 NOTE — Assessment & Plan Note (Signed)
Has upcoming appt w/ neuro.  Currently sxs free.  Feels her sxs are better controlled when she gets B12 shots.  Will check labs.  Reviewed red flags that should prompt medical attention.

## 2011-09-06 NOTE — Assessment & Plan Note (Signed)
Unclear etiology.  Check labs to r/o infection/inflammation.  If sxs don't improve will refer to GI.  Pt expressed understanding and is in agreement w/ plan.

## 2011-09-07 ENCOUNTER — Ambulatory Visit (INDEPENDENT_AMBULATORY_CARE_PROVIDER_SITE_OTHER): Payer: 59 | Admitting: Family Medicine

## 2011-09-07 ENCOUNTER — Encounter: Payer: Self-pay | Admitting: Family Medicine

## 2011-09-07 VITALS — BP 133/94 | HR 98 | Temp 97.5°F | Ht 67.0 in | Wt 122.0 lb

## 2011-09-07 DIAGNOSIS — M25519 Pain in unspecified shoulder: Secondary | ICD-10-CM

## 2011-09-07 NOTE — Patient Instructions (Signed)
Your history and exam are consistent with biceps tendinopathy. Ice the area 15 minutes at a time as needed for pain. Tylenol 500mg  1-2 tabs three times a day as needed for pain. Aleve 2 tabs twice a day with food for pain and inflammation as needed. Avoid painful activities as much as possible. Do home exercises shown - 3 sets of 10 once a day. If you are struggling and want to do physical therapy, call me and we can order this for you. Otherwise follow-up with me as needed.

## 2011-09-08 ENCOUNTER — Encounter: Payer: Self-pay | Admitting: Family Medicine

## 2011-09-08 NOTE — Progress Notes (Signed)
Subjective:    Patient ID: Jaclyn Spencer, female    DOB: 26-Nov-1955, 55 y.o.   MRN: [id number]  PCP: Dr. Beverely Low  HPI 55 yo F here for left shoulder pain.  Patient denies known injury or trauma. States pain started about 3-4 months ago in anterior left shoulder. Pain worse with reaching activities, reaching behind her and up overhead. Is right handed. Pain is acute and only lasts seconds. No night pain. Has not taken any medications for this though recently prescribed naproxen. Pain worse lying on this shoulder.  Past Medical History  Diagnosis Date  . Diverticulosis   . Anxiety   . GERD (gastroesophageal reflux disease)   . Allergy     rhinitis  . Cancer     skin  . Vitamin D deficiency   . Chest pain   . Abdominal pain   . B12 deficiency   . Weight loss     Current Outpatient Prescriptions on File Prior to Visit  Medication Sig Dispense Refill  . ALPRAZolam (XANAX) 0.25 MG tablet Take 1 tablet (0.25 mg total) by mouth 3 (three) times daily as needed for anxiety.  30 tablet  0  . mometasone (NASONEX) 50 MCG/ACT nasal spray 2 sprays by Nasal route daily.  17 g  6  . naproxen (NAPROSYN) 500 MG tablet Take 1 tablet (500 mg total) by mouth 2 (two) times daily with a meal.  60 tablet  2    Past Surgical History  Procedure Date  . Mohs surgery     No Known Allergies  History   Social History  . Marital Status: Married    Spouse Name: N/A    Number of Children: N/A  . Years of Education: N/A   Occupational History  . Not on file.   Social History Main Topics  . Smoking status: Former Games developer  . Smokeless tobacco: Not on file  . Alcohol Use: Yes  . Drug Use: No  . Sexually Active: Not on file   Other Topics Concern  . Not on file   Social History Narrative  . No narrative on file    Family History  Problem Relation Age of Onset  . Colitis Mother     crohns  . Inflammatory bowel disease Mother   . Cancer Mother     cancer  . Coronary artery  disease Mother   . Heart attack Mother   . Cancer Father     lung  . Coronary artery disease Paternal Grandfather   . Cancer Other     stomach.Marland Kitchenaunt & grandmother  . Diabetes Neg Hx   . Hyperlipidemia Neg Hx   . Hypertension Neg Hx   . Sudden death Neg Hx     BP 133/94  Pulse 98  Temp(Src) 97.5 F (36.4 C) (Oral)  Ht 5\' 7"  (1.702 m)  Wt 122 lb (55.339 kg)  BMI 19.11 kg/m2  Review of Systems See HPI above.    Objective:   Physical Exam Gen: NAD  L shoulder: No swelling, ecchymoses.  No gross deformity. TTP at biceps tendon.  No TTP AC joint or elsewhere about shoulder. FROM without painful arc. Negative Hawkins, Neers. Positive speeds, equivocal yergasons. Negative crossover adduction. Negative Empty can and resisted internal/external rotation with 5/5 strength. Negative apprehension. NV intact distally.  R shoulder: FROM without pain or weakness.    Assessment & Plan:  1. Left shoulder pain - most consistent with biceps tendinopathy though her symptoms would suggest an element of  rotator cuff syndrome.  Start home exercise program with theraband, handout provided as well.  Tylenol, nsaids as needed for pain.  Icing as needed.  Advised if not improving to call me and would move forward with formal physical therapy.  Otherwise f/u prn.

## 2011-09-08 NOTE — Assessment & Plan Note (Signed)
most consistent with biceps tendinopathy though her symptoms would suggest an element of rotator cuff syndrome.  Start home exercise program with theraband, handout provided as well.  Tylenol, nsaids as needed for pain.  Icing as needed.  Advised if not improving to call me and would move forward with formal physical therapy.  Otherwise f/u prn.

## 2011-11-26 ENCOUNTER — Ambulatory Visit: Payer: 59 | Admitting: Family Medicine

## 2011-12-09 ENCOUNTER — Ambulatory Visit: Payer: 59 | Admitting: Family Medicine

## 2012-01-14 ENCOUNTER — Ambulatory Visit: Payer: 59 | Admitting: Family Medicine

## 2012-02-04 ENCOUNTER — Ambulatory Visit: Payer: 59 | Admitting: Family Medicine

## 2012-02-21 ENCOUNTER — Ambulatory Visit: Payer: 59 | Admitting: Family Medicine

## 2012-02-21 ENCOUNTER — Ambulatory Visit: Payer: Self-pay

## 2012-02-21 VITALS — BP 113/78 | HR 91 | Temp 98.0°F | Resp 16 | Ht 65.5 in | Wt 122.6 lb

## 2012-02-21 DIAGNOSIS — T148 Other injury of unspecified body region: Secondary | ICD-10-CM

## 2012-02-21 DIAGNOSIS — W57XXXA Bitten or stung by nonvenomous insect and other nonvenomous arthropods, initial encounter: Secondary | ICD-10-CM

## 2012-02-21 MED ORDER — DOXYCYCLINE HYCLATE 100 MG PO TABS: 100.0000 mg | ORAL_TABLET | Freq: Two times a day (BID) | ORAL | Status: AC

## 2012-02-21 NOTE — Progress Notes (Signed)
  Subjective:    Patient ID: Jaclyn Spencer, female    DOB: July 14, 1956, 56 y.o.   MRN: [id number]  HPI  Patient presents to clinic after removing a tick on Thurday. Now with streaky area from bite site to (L) groin  Concerned she may contract tularemia.  Denies fever or chills or other systemic symptoms Review of Systems     Objective:   Physical Exam  Constitutional: She appears well-developed.  Neck: Neck supple.  Cardiovascular: Normal rate, regular rhythm and normal heart sounds.   Pulmonary/Chest: Effort normal.  Abdominal: Soft. Bowel sounds are normal.  Skin:       Erythema around bite site. Entire tick removed. Slight streaking from bite to inguinal region.  No inguinal nodes palpable        Assessment & Plan:  Tick bite  Doxycycline 100 mg BID X 7 days Anticipatory guidance

## 2012-03-08 ENCOUNTER — Encounter: Payer: Self-pay | Admitting: Family Medicine

## 2012-03-08 ENCOUNTER — Ambulatory Visit (INDEPENDENT_AMBULATORY_CARE_PROVIDER_SITE_OTHER): Payer: 59 | Admitting: Family Medicine

## 2012-03-08 VITALS — BP 120/77 | HR 79 | Temp 98.1°F | Ht 66.25 in | Wt 125.4 lb

## 2012-03-08 DIAGNOSIS — H6980 Other specified disorders of Eustachian tube, unspecified ear: Secondary | ICD-10-CM | POA: Insufficient documentation

## 2012-03-08 DIAGNOSIS — J302 Other seasonal allergic rhinitis: Secondary | ICD-10-CM | POA: Insufficient documentation

## 2012-03-08 DIAGNOSIS — T148 Other injury of unspecified body region: Secondary | ICD-10-CM

## 2012-03-08 DIAGNOSIS — T148XXA Other injury of unspecified body region, initial encounter: Secondary | ICD-10-CM

## 2012-03-08 DIAGNOSIS — W57XXXA Bitten or stung by nonvenomous insect and other nonvenomous arthropods, initial encounter: Secondary | ICD-10-CM | POA: Insufficient documentation

## 2012-03-08 DIAGNOSIS — H6990 Unspecified Eustachian tube disorder, unspecified ear: Secondary | ICD-10-CM | POA: Insufficient documentation

## 2012-03-08 DIAGNOSIS — J309 Allergic rhinitis, unspecified: Secondary | ICD-10-CM

## 2012-03-08 HISTORY — DX: Bitten or stung by nonvenomous insect and other nonvenomous arthropods, initial encounter: W57.XXXA

## 2012-03-08 HISTORY — DX: Unspecified eustachian tube disorder, unspecified ear: H69.90

## 2012-03-08 MED ORDER — CIPROFLOXACIN HCL 500 MG PO TABS: 500.0000 mg | ORAL_TABLET | Freq: Two times a day (BID) | ORAL | Status: AC

## 2012-03-08 MED ORDER — MOMETASONE FUROATE 50 MCG/ACT NA SUSP: 2.0000 | Freq: Every day | NASAL | Status: AC

## 2012-03-08 NOTE — Assessment & Plan Note (Signed)
New.  Pt had unusual rxn of redness and swelling.  Denies fevers, chills, rash, HAs, joint pains that would be consistent w/ RMSF or Lyme.  Check labs to r/o infxn.  tx prn.

## 2012-03-08 NOTE — Patient Instructions (Signed)
We'll notify you of your lab results Switch to Zyrtec daily Continue the Nasonex- 2 sprays each nostril daily Call with any questions or concerns Have a great summer!!

## 2012-03-08 NOTE — Assessment & Plan Note (Signed)
New.  Encouraged daily nasal spray use to decrease turbinate edema.  Pt expressed understanding and is in agreement w/ plan.

## 2012-03-08 NOTE — Progress Notes (Signed)
  Subjective:    Patient ID: Jaclyn Spencer, female    DOB: [birth year], 56 y.o.   MRN: [id number]  HPI Tick bite- seen at [facility] end of May, got bit on abdomen and had 'unusual rxn'.  Describes band of redness and swelling across abdomen.  No fevers.  Denies joint aches/pain.  + mild HAs.  No rash.  L ear fullness- hx of similar, sxs are intermittent.  + itching.  + PND, nasal congestion.  No ear drainage.  No fevers.  Intermittent sinus pressure.  Taking OTC antihistamine and Nasonex prn.   Review of Systems For ROS see HPI     Objective:   Physical Exam  Vitals reviewed. Constitutional: She appears well-developed and well-nourished. No distress.  HENT:  Head: Normocephalic and atraumatic.  Right Ear: Tympanic membrane normal.  Left Ear: Tympanic membrane normal.  Nose: Mucosal edema and rhinorrhea present. Right sinus exhibits no maxillary sinus tenderness and no frontal sinus tenderness. Left sinus exhibits no maxillary sinus tenderness and no frontal sinus tenderness.  Mouth/Throat: Mucous membranes are normal. Posterior oropharyngeal erythema (w/ PND) present.  Eyes: Conjunctivae and EOM are normal. Pupils are equal, round, and reactive to light.  Neck: Normal range of motion. Neck supple.  Cardiovascular: Normal rate, regular rhythm and normal heart sounds.   Pulmonary/Chest: Effort normal and breath sounds normal. No respiratory distress. She has no wheezes. She has no rales.  Lymphadenopathy:    She has no cervical adenopathy.  Skin: Skin is warm and dry. There is erythema (mild ring of erythema surrounding tick bite on L lower flank, no induration, fluctuance or drainage).          Assessment & Plan:

## 2012-03-08 NOTE — Assessment & Plan Note (Signed)
New.  Pt to switch from Claritin to Zyrtec for better symptom relief.  Daily nasal steroid use.  Pt expressed understanding and is in agreement w/ plan.

## 2012-03-09 LAB — ROCKY MTN SPOTTED FVR ABS PNL(IGG+IGM)
RMSF IgG: 0.79 IV
RMSF IgM: 0.11 IV

## 2012-03-10 LAB — B. BURGDORFI ANTIBODIES: B burgdorferi Ab IgG+IgM: 0.29 {ISR}

## 2012-09-27 ENCOUNTER — Encounter: Payer: Self-pay | Admitting: Family Medicine

## 2012-09-27 ENCOUNTER — Ambulatory Visit (INDEPENDENT_AMBULATORY_CARE_PROVIDER_SITE_OTHER): Payer: 59 | Admitting: Family Medicine

## 2012-09-27 VITALS — BP 106/68 | HR 84 | Temp 98.3°F | Ht 65.75 in | Wt 126.4 lb

## 2012-09-27 DIAGNOSIS — E538 Deficiency of other specified B group vitamins: Secondary | ICD-10-CM

## 2012-09-27 DIAGNOSIS — E559 Vitamin D deficiency, unspecified: Secondary | ICD-10-CM

## 2012-09-27 DIAGNOSIS — Z0000 Encounter for general adult medical examination without abnormal findings: Secondary | ICD-10-CM

## 2012-09-27 LAB — HEPATIC FUNCTION PANEL
ALT: 16 U/L (ref 0–35)
AST: 20 U/L (ref 0–37)
Albumin: 4 g/dL (ref 3.5–5.2)
Alkaline Phosphatase: 43 U/L (ref 39–117)
Bilirubin, Direct: 0 mg/dL (ref 0.0–0.3)
Total Bilirubin: 0.8 mg/dL (ref 0.3–1.2)
Total Protein: 7 g/dL (ref 6.0–8.3)

## 2012-09-27 LAB — POCT URINALYSIS DIPSTICK
Glucose, UA: NEGATIVE
Nitrite, UA: NEGATIVE
Urobilinogen, UA: 0.2

## 2012-09-27 LAB — LIPID PANEL
HDL: 83 mg/dL (ref >39.00)
Triglycerides: 62 mg/dL (ref 0.0–149.0)

## 2012-09-27 LAB — LDL CHOLESTEROL, DIRECT: Direct LDL: 117.5 mg/dL

## 2012-09-27 LAB — CBC WITH DIFFERENTIAL/PLATELET
Eosinophils Relative: 1.6 % (ref 0.0–5.0)
HCT: 39.8 % (ref 36.0–46.0)
Lymphs Abs: 1.8 10*3/uL (ref 0.7–4.0)
MCHC: 34 g/dL (ref 30.0–36.0)
MCV: 94.1 fl (ref 78.0–100.0)
Monocytes Absolute: 0.4 10*3/uL (ref 0.1–1.0)
Platelets: 222 10*3/uL (ref 150.0–400.0)
RDW: 13.4 % (ref 11.5–14.6)
WBC: 4.8 10*3/uL (ref 4.5–10.5)

## 2012-09-27 LAB — C-REACTIVE PROTEIN: CRP: <0.5 mg/dL (ref 0.5–20.0)

## 2012-09-27 LAB — BASIC METABOLIC PANEL
Calcium: 8.9 mg/dL (ref 8.4–10.5)
GFR: 105.8 mL/min (ref >60.00)
Glucose, Bld: 92 mg/dL (ref 70–99)
Sodium: 139 mEq/L (ref 135–145)

## 2012-09-27 LAB — TSH: TSH: 1.38 u[IU]/mL (ref 0.35–5.50)

## 2012-09-27 MED ORDER — ALPRAZOLAM 0.25 MG PO TABS: 0.2500 mg | ORAL_TABLET | Freq: Three times a day (TID) | ORAL | Status: DC | PRN

## 2012-09-27 MED ORDER — ESZOPICLONE 2 MG PO TABS: 2.0000 mg | ORAL_TABLET | Freq: Every day | ORAL | Status: DC

## 2012-09-27 NOTE — Addendum Note (Signed)
Addended by: Silvio Pate D on: 09/27/2012 04:19 PM   Modules accepted: Orders

## 2012-09-27 NOTE — Assessment & Plan Note (Signed)
Check labs.  Replete prn. 

## 2012-09-27 NOTE — Progress Notes (Signed)
  Subjective:    Patient ID: Jaclyn Spencer, female    DOB: Feb 02, 1956, 56 y.o.   MRN: [id number]  HPI CPE- UTD on colonoscopy, pap, due for mammo.  Pt would like CRP done as part of CPE.   Review of Systems Patient reports no vision/ hearing changes, adenopathy,fever, weight change,  persistant/recurrent hoarseness , swallowing issues, chest pain, palpitations, edema, persistant/recurrent cough, hemoptysis, dyspnea (rest/exertional/paroxysmal nocturnal), gastrointestinal bleeding (melena, rectal bleeding), abdominal pain, significant heartburn, bowel changes, GU symptoms (dysuria, hematuria, incontinence), Gyn symptoms (abnormal  bleeding, pain),  syncope, focal weakness, memory loss, numbness & tingling, skin/hair/nail changes, abnormal bruising or bleeding, anxiety, or depression.     Objective:   Physical Exam General Appearance:    Alert, cooperative, no distress, appears stated age  Head:    Normocephalic, without obvious abnormality, atraumatic  Eyes:    PERRL, conjunctiva/corneas clear, EOM's intact, fundi    benign, both eyes  Ears:    Normal TM's and external ear canals, both ears  Nose:   Nares normal, septum midline, mucosa normal, no drainage    or sinus tenderness  Throat:   Lips, mucosa, and tongue normal; teeth and gums normal  Neck:   Supple, symmetrical, trachea midline, no adenopathy;    Thyroid: no enlargement/tenderness/nodules  Back:     Symmetric, no curvature, ROM normal, no CVA tenderness  Lungs:     Clear to auscultation bilaterally, respirations unlabored  Chest Wall:    No tenderness or deformity   Heart:    Regular rate and rhythm, S1 and S2 normal, no murmur, rub   or gallop  Breast Exam:    Deferred to GYN  Abdomen:     Soft, non-tender, bowel sounds active all four quadrants,    no masses, no organomegaly  Genitalia:    Deferred to GYN  Rectal:    Extremities:   Extremities normal, atraumatic, no cyanosis or edema  Pulses:   2+ and symmetric all  extremities  Skin:   Skin color, texture, turgor normal, no rashes or lesions  Lymph nodes:   Cervical, supraclavicular, and axillary nodes normal  Neurologic:   CNII-XII intact, normal strength, sensation and reflexes    throughout          Assessment & Plan:

## 2012-09-27 NOTE — Assessment & Plan Note (Signed)
Pt's PE WNL.  UTD on health maintenance w/ exception of mammo- pt to schedule.  Check labs.  Anticipatory guidance provided.

## 2012-09-27 NOTE — Patient Instructions (Addendum)
You look great!  Keep up the good work! We'll notify you of your lab results and make any changes if needed Call with any questions or concerns Schedule your mammo at your convenience Happy [place] Ouch! Happy New Year!!

## 2012-10-01 LAB — VITAMIN D 1,25 DIHYDROXY: Vitamin D 1, 25 (OH)2 Total: 55 pg/mL (ref 18–72)

## 2012-10-18 ENCOUNTER — Encounter: Payer: Self-pay | Admitting: Family Medicine

## 2012-10-20 ENCOUNTER — Other Ambulatory Visit: Payer: Self-pay | Admitting: *Deleted

## 2012-10-20 DIAGNOSIS — G4700 Insomnia, unspecified: Secondary | ICD-10-CM

## 2012-10-20 MED ORDER — ZOLPIDEM TARTRATE 10 MG PO TABS: 10.0000 mg | ORAL_TABLET | Freq: Every evening | ORAL | Status: DC | PRN

## 2012-10-20 NOTE — Telephone Encounter (Signed)
Rx sent 

## 2012-10-21 ENCOUNTER — Telehealth: Payer: Self-pay | Admitting: *Deleted

## 2012-10-21 NOTE — Telephone Encounter (Signed)
Generic Ambien was sent yesterday

## 2012-10-21 NOTE — Telephone Encounter (Signed)
Patient called triage, states lunesta too expensive wants to try something else. Pt already has medication agreement on file, please advise.

## 2012-11-16 ENCOUNTER — Telehealth: Payer: Self-pay | Admitting: Family Medicine

## 2012-11-16 DIAGNOSIS — G4700 Insomnia, unspecified: Secondary | ICD-10-CM

## 2012-11-16 MED ORDER — ZOLPIDEM TARTRATE 10 MG PO TABS: ORAL_TABLET | ORAL | Status: DC

## 2012-11-16 NOTE — Telephone Encounter (Signed)
[facility]ed [facility] who states that Rx was never received on 10-20-12, Rx sent [facility] Pt aware via VM Rx sent

## 2012-11-16 NOTE — Telephone Encounter (Signed)
Patient called regarding her request in January to change from Lunesta to something less expensive. She never received a call back. I advised pt that we changed her medication to zolpidem and she states she would like this sent to [facility] pharmacy. She was not aware we changed her rx and did not go to [facility] to pick up zolpidem.

## 2013-07-06 ENCOUNTER — Other Ambulatory Visit: Payer: Self-pay | Admitting: Family Medicine

## 2013-07-06 NOTE — Telephone Encounter (Signed)
Med filled. Letter mailed to pt to make a CPE appt.

## 2013-08-03 ENCOUNTER — Other Ambulatory Visit: Payer: Self-pay

## 2013-09-12 ENCOUNTER — Telehealth: Payer: Self-pay | Admitting: *Deleted

## 2013-09-12 ENCOUNTER — Telehealth: Payer: Self-pay | Admitting: General Practice

## 2013-09-12 NOTE — Telephone Encounter (Signed)
Last OV 09-27-12 Xanax filled same day #30 with 1  Csc on file

## 2013-09-12 NOTE — Telephone Encounter (Signed)
Last seen-09/27/2012  Last filled-09/27/2012 #30, 1 refill   UDS-not on file, contract signe d  Please advise. SW

## 2013-09-12 NOTE — Telephone Encounter (Signed)
Ok for #30, 1 refill- please have pt schedule CPE b/c she will be due on 12/31

## 2013-09-12 NOTE — Telephone Encounter (Signed)
Med called in. Left patient a message stating that she needs to schedule an OV

## 2013-09-13 NOTE — Telephone Encounter (Signed)
Already filled on 12/16

## 2013-09-13 NOTE — Telephone Encounter (Signed)
Noted  

## 2014-06-18 ENCOUNTER — Other Ambulatory Visit: Payer: Self-pay

## 2014-06-18 DIAGNOSIS — Z1231 Encounter for screening mammogram for malignant neoplasm of breast: Secondary | ICD-10-CM

## 2014-06-25 ENCOUNTER — Other Ambulatory Visit: Payer: Self-pay | Admitting: Family Medicine

## 2014-06-25 DIAGNOSIS — M810 Age-related osteoporosis without current pathological fracture: Secondary | ICD-10-CM

## 2014-06-27 DIAGNOSIS — M810 Age-related osteoporosis without current pathological fracture: Secondary | ICD-10-CM | POA: Insufficient documentation

## 2014-06-28 ENCOUNTER — Ambulatory Visit
Admission: RE | Admit: 2014-06-28 | Discharge: 2014-06-28 | Disposition: A | Discharge status: Home / Self Care | Payer: BC Managed Care – PPO | Source: Ambulatory Visit

## 2014-06-28 DIAGNOSIS — Z1231 Encounter for screening mammogram for malignant neoplasm of breast: Secondary | ICD-10-CM

## 2014-07-09 ENCOUNTER — Other Ambulatory Visit: Payer: Self-pay | Admitting: Family Medicine

## 2014-07-09 ENCOUNTER — Ambulatory Visit
Admission: RE | Admit: 2014-07-09 | Discharge: 2014-07-09 | Disposition: A | Discharge status: Home / Self Care | Payer: BC Managed Care – PPO | Source: Ambulatory Visit | Attending: Family Medicine | Admitting: Family Medicine

## 2014-07-09 DIAGNOSIS — M8580 Other specified disorders of bone density and structure, unspecified site: Secondary | ICD-10-CM

## 2014-07-09 DIAGNOSIS — M810 Age-related osteoporosis without current pathological fracture: Secondary | ICD-10-CM

## 2015-02-15 ENCOUNTER — Encounter: Payer: Self-pay | Admitting: Internal Medicine

## 2016-06-30 DIAGNOSIS — K5901 Slow transit constipation: Secondary | ICD-10-CM | POA: Insufficient documentation

## 2016-07-16 DIAGNOSIS — Z1211 Encounter for screening for malignant neoplasm of colon: Secondary | ICD-10-CM | POA: Insufficient documentation

## 2016-07-16 DIAGNOSIS — G4700 Insomnia, unspecified: Secondary | ICD-10-CM

## 2016-07-16 HISTORY — DX: Encounter for screening for malignant neoplasm of colon: Z12.11

## 2016-07-16 HISTORY — DX: Insomnia, unspecified: G47.00

## 2016-08-12 ENCOUNTER — Ambulatory Visit (INDEPENDENT_AMBULATORY_CARE_PROVIDER_SITE_OTHER): Payer: BLUE CROSS/BLUE SHIELD | Admitting: Gastroenterology

## 2016-08-12 ENCOUNTER — Encounter: Payer: Self-pay | Admitting: Gastroenterology

## 2016-08-12 VITALS — BP 118/76 | HR 84 | Ht 65.5 in | Wt 128.1 lb

## 2016-08-12 DIAGNOSIS — K6289 Other specified diseases of anus and rectum: Secondary | ICD-10-CM

## 2016-08-12 DIAGNOSIS — K5900 Constipation, unspecified: Secondary | ICD-10-CM

## 2016-08-12 DIAGNOSIS — K602 Anal fissure, unspecified: Secondary | ICD-10-CM | POA: Diagnosis not present

## 2016-08-12 MED ORDER — AMBULATORY NON FORMULARY MEDICATION: 3 refills | Status: DC

## 2016-08-12 NOTE — Patient Instructions (Addendum)
Use Benefiber 1 tablespoon three times a day with meals  We have given you a printed prescription for you to take to [facility]  Follow up 10/14/2016 at 10:30am

## 2016-08-12 NOTE — Progress Notes (Signed)
Jaclyn Spencer    [id number]    1956/03/16  Primary Care 49 M., MD  Referring Physician: No referring provider defined for this encounter.  Chief complaint:  Rectal pain, constipation  HPI: 60 year old female here for new patient evaluation with complaints of rectal pain for past few weeks. She was remotely followed by Dr. Olevia Perches, as seen in December 2011 She had worsening of baseline constipation with hard stool last month , since then has been having pain in the anal area after a bowel movement , does improve with ibuprofen . Denies any blood per rectum . She has also noticed worsening rectal pain when she sits for a prolonged period or tries to bend. Constipation has improved with dietary changes after she stopped eating gluten and carbohydrates Denies any nausea, vomiting, abdominal pain, melena or bright red blood per rectum Colonoscopy November 2009 showed sigmoid diverticulosis and internal hemorrhoids otherwise normal   Outpatient Encounter Prescriptions as of 08/12/2016  Medication Sig  . NASONEX 50 MCG/ACT nasal spray Place 2 sprays into the nose daily.  . [DISCONTINUED] eszopiclone (LUNESTA) 2 MG TABS Take 1 tablet (2 mg total) by mouth at bedtime. Take immediately before bedtime (Patient not taking: Reported on 08/12/2016)  . [DISCONTINUED] zolpidem (AMBIEN) 10 MG tablet Take 1/2 to 1 tab at bedtime as needed for sleep (Patient not taking: Reported on 08/12/2016)   No facility-administered encounter medications on file as of 08/12/2016.     Allergies as of 08/12/2016  . (No Known Allergies)    Past Medical History:  Diagnosis Date  . Abdominal pain   . Allergy    rhinitis  . Anxiety   . B12 deficiency   . Basal cell carcinoma   . Chest pain   . Diverticulosis   . GERD (gastroesophageal reflux disease)   . Vitamin D deficiency   . Weight loss     Past Surgical History:  Procedure Laterality Date  . MOHS SURGERY     nose      Family History  Problem Relation Age of Onset  . Colitis Mother   . Inflammatory bowel disease Mother   . Coronary artery disease Mother   . Heart attack Mother   . Kidney cancer Mother   . Lung cancer Father   . Coronary artery disease Paternal Grandfather   . Stomach cancer Paternal Grandmother   . Stomach cancer Paternal Aunt   . Diabetes Neg Hx   . Hyperlipidemia Neg Hx   . Hypertension Neg Hx   . Sudden death Neg Hx     Social History   Social History  . Marital status: Married    Spouse name: N/A  . Number of children: 0  . Years of education: N/A   Occupational History  . Not on file.   Social History Main Topics  . Smoking status: Former Smoker    Quit date: 02/20/1977  . Smokeless tobacco: Never Used  . Alcohol use Yes     Comment: 1 per day  . Drug use: No  . Sexual activity: Not on file   Other Topics Concern  . Not on file   Social History Narrative  . No narrative on file      Review of systems: Review of Systems  Constitutional: Negative for fever and chills.  HENT: Negative.   Eyes: Negative for blurred vision.  Respiratory: Negative for cough, shortness of breath and wheezing.   Cardiovascular: Negative  for chest pain and palpitations.  Gastrointestinal: as per HPI Genitourinary: Negative for dysuria, urgency, frequency and hematuria.  Musculoskeletal: Negative for myalgias, back pain and joint pain.  Skin: Negative for itching and rash.  Neurological: Negative for dizziness, tremors, focal weakness, seizures and loss of consciousness.  Endo/Heme/Allergies: Positive for seasonal allergies.  Psychiatric/Behavioral: Negative for depression, suicidal ideas and hallucinations.  All other systems reviewed and are negative.   Physical Exam: Vitals:   08/12/16 0908  BP: 118/76  Pulse: 84   Body mass index is 21 kg/m. Gen:      No acute distress HEENT:  EOMI, sclera anicteric Neck:     No masses; no thyromegaly Lungs:    Clear to  auscultation bilaterally; normal respiratory effort CV:         Regular rate and rhythm; no murmurs Abd:      + bowel sounds; soft, non-tender; no palpable masses, no distension Ext:    No edema; adequate peripheral perfusion Skin:      Warm and dry; no rash Neuro: alert and oriented x 3 Psych: normal mood and affect Rectal exam: Increased anal sphincter tone, anterior anal fissure with tenderness Anoscopy was not performed   Data Reviewed:  Reviewed labs, radiology imaging, old records and pertinent past GI work up   Assessment and Plan/Recommendations: 60 year old female with complaints of constipation and anal pain for the past few weeks Patient's history and rectal exam consistent with anal fissure Start Benefiber 1 tablespoon 3 times a day with meals to improve constipation Rectal nitroglycerin 0.125% every 6-8 hours for 8-10 weeks Return in 2 months or sooner if needed  Greater than 50% of the time used for counseling as well as treatment plan and follow-up. She had multiple questions which were answered to her satisfaction  K. Denzil Magnuson , MD [phone] Mon-Fri 8a-5p [phone] after 5p, weekends, holidays  CC: No ref. provider found

## 2016-10-14 ENCOUNTER — Ambulatory Visit: Payer: BLUE CROSS/BLUE SHIELD | Admitting: Gastroenterology

## 2016-11-25 ENCOUNTER — Ambulatory Visit: Payer: BLUE CROSS/BLUE SHIELD | Admitting: Gastroenterology

## 2017-01-07 ENCOUNTER — Ambulatory Visit: Payer: BLUE CROSS/BLUE SHIELD | Admitting: Gastroenterology

## 2017-07-01 ENCOUNTER — Telehealth: Payer: Self-pay | Admitting: Cardiology

## 2017-07-01 NOTE — Progress Notes (Signed)
Cardiology Office Note   Date:  07/04/2017   ID:  Jaclyn Spencer 1956/07/17, MRN [id number]  PCP:  Robyne Peers, MD  Cardiologist:   Minus Breeding, MD  Referring:  Robyne Peers, MD  Chief Complaint  Patient presents with  . Chest Pain      History of Present Illness: Jaclyn Spencer is a 61 y.o. female who is referred by Robyne Peers, MD for evaluation of chest pain.  She has no past cardiac history. About 3 weeks ago she had a significant episode of arm pain. She had left chest pain. She also had some back discomfort. This was 7 out of 10 and associated shortness of breath. It was happening with emotional stress. She has been having mid back pain with activity such as walking briskly for 100 yards. It can be dull, burning, stabbing. She has some associated shortness of breath. She's had no past cardiac history although she has had a stress test 20 years ago for palpitations. She denies any PND or orthopnea. She's had no presyncope or syncope. When she gets discomfort it goes away spontaneously. It is not made worse with deep breath or moving. She did have a chest x-ray with some vague abnormality possibly pleural thickening and also the nodule and she is to have this repeated.  Past Medical History:  Diagnosis Date  . Abdominal pain   . Allergy    rhinitis  . Anxiety   . B12 deficiency   . Basal cell carcinoma   . Diverticulosis   . GERD (gastroesophageal reflux disease)   . Vitamin D deficiency   . Weight loss     Past Surgical History:  Procedure Laterality Date  . MOHS SURGERY     nose     Current Outpatient Prescriptions  Medication Sig Dispense Refill  . NASONEX 50 MCG/ACT nasal spray Place 2 sprays into the nose daily. 17 g 2   No current facility-administered medications for this visit.     Allergies:   Patient has no known allergies.    Social History:  The patient  reports that she quit smoking about 40 years ago. She has never used  smokeless tobacco. She reports that she drinks alcohol. She reports that she does not use drugs.   Family History:  The patient's family history includes Colitis in her mother; Coronary artery disease in her paternal grandfather; Coronary artery disease (age of onset: 54) in her mother; Heart attack in her mother; Inflammatory bowel disease in her mother; Kidney cancer in her mother; Lung cancer in her father; Stomach cancer in her paternal aunt and paternal grandmother.    ROS:  Please see the history of present illness.   Otherwise, review of systems are positive for none.   All other systems are reviewed and negative.    PHYSICAL EXAM: VS:  BP 112/70 (BP Location: Right Arm)   Pulse 92   Ht 5\' 6"  (1.676 m)   Wt 127 lb (57.6 kg)   BMI 20.50 kg/m  , BMI Body mass index is 20.5 kg/m. GENERAL:  Well appearing HEENT:  Pupils equal round and reactive, fundi not visualized, oral mucosa unremarkable NECK:  No jugular venous distention, waveform within normal limits, carotid upstroke brisk and symmetric, no bruits, no thyromegaly LYMPHATICS:  No cervical, inguinal adenopathy LUNGS:  Clear to auscultation bilaterally BACK:  No CVA tenderness CHEST:  Unremarkable HEART:  PMI not displaced or sustained,S1 and S2 within normal limits,  no S3, no S4, no clicks, no rubs, no murmurs ABD:  Flat, positive bowel sounds normal in frequency in pitch, no bruits, no rebound, no guarding, no midline pulsatile mass, no hepatomegaly, no splenomegaly EXT:  2 plus pulses throughout, no edema, no cyanosis no clubbing SKIN:  No rashes no nodules NEURO:  Cranial nerves II through XII grossly intact, motor grossly intact throughout PSYCH:  Cognitively intact, oriented to person place and time    EKG:  EKG is ordered today. The ekg ordered today demonstrates sinus rhythm, rate 92, axis within normal limits. Intervals within normal limits, no acute T-wave changes.   Recent Labs: No results found for requested  labs within last 8760 hours.     Wt Readings from Last 3 Encounters:  07/02/17 127 lb (57.6 kg)  08/12/16 128 lb 2 oz (58.1 kg)  09/27/12 126 lb 6.4 oz (57.3 kg)      Other studies Reviewed: Additional studies/ records that were reviewed today include: Office records. Review of the above records demonstrates:  Please see elsewhere in the note.     ASSESSMENT AND PLAN:   CHEST PAIN:  This is somewhat atypical but could represent angina. I will bring the patient back for a POET (Plain Old Exercise Test). This will allow me to screen for obstructive coronary disease, risk stratify and very importantly provide a prescription for exercise. I'm also going to bring her back for a coronary calcium score. Further testing will be based on this result.   RISK REDUCTION:  I will check a lipid profile when she returns for follow up.       Current medicines are reviewed at length with the patient today.  The patient does not have concerns regarding medicines.  The following changes have been made:  no change  Labs/ tests ordered today include:   Orders Placed This Encounter  Procedures  . CT CARDIAC SCORING  . Lipid panel  . Exercise Tolerance Test  . EKG 12-Lead     Disposition:   FU with me as needed.      Signed, Minus Breeding, MD  07/04/2017 12:49 PM    [facility]

## 2017-07-01 NOTE — Telephone Encounter (Signed)
Received records from [facility] for appointment on 07/02/17 with Dr Percival Spanish.  Records put with Dr Hochrein's schedule for 07/02/17. lp

## 2017-07-02 ENCOUNTER — Ambulatory Visit (INDEPENDENT_AMBULATORY_CARE_PROVIDER_SITE_OTHER): Payer: BLUE CROSS/BLUE SHIELD | Admitting: Cardiology

## 2017-07-02 ENCOUNTER — Encounter: Payer: Self-pay | Admitting: Cardiology

## 2017-07-02 VITALS — BP 112/70 | HR 92 | Ht 66.0 in | Wt 127.0 lb

## 2017-07-02 DIAGNOSIS — R0602 Shortness of breath: Secondary | ICD-10-CM

## 2017-07-02 DIAGNOSIS — R079 Chest pain, unspecified: Secondary | ICD-10-CM | POA: Diagnosis not present

## 2017-07-02 DIAGNOSIS — E785 Hyperlipidemia, unspecified: Secondary | ICD-10-CM | POA: Diagnosis not present

## 2017-07-02 NOTE — Patient Instructions (Signed)
Medication Instructions:  None Ordered  If you need a refill on your cardiac medications before your next appointment, please call your pharmacy.  Labwork: Fasting Lipids  Testing/Procedures: Your physician has requested that you have an exercise tolerance test. For further information please visit [email or web address]. Please also follow instruction sheet, as given.  Your physician has requested that you have Coronary Calcium Score Test. This Test is done at our [facility].   Follow-Up: Your physician wants you to follow-up in: As Needed .   Thank you for choosing [facility] at [facility]!!

## 2017-07-04 ENCOUNTER — Encounter: Payer: Self-pay | Admitting: Cardiology

## 2017-07-07 ENCOUNTER — Ambulatory Visit (INDEPENDENT_AMBULATORY_CARE_PROVIDER_SITE_OTHER): Payer: BLUE CROSS/BLUE SHIELD

## 2017-07-07 ENCOUNTER — Ambulatory Visit (INDEPENDENT_AMBULATORY_CARE_PROVIDER_SITE_OTHER)
Admission: RE | Admit: 2017-07-07 | Discharge: 2017-07-07 | Disposition: A | Discharge status: Home / Self Care | Payer: Self-pay | Source: Ambulatory Visit | Attending: Cardiology | Admitting: Cardiology

## 2017-07-07 ENCOUNTER — Other Ambulatory Visit: Payer: BLUE CROSS/BLUE SHIELD | Admitting: *Deleted

## 2017-07-07 DIAGNOSIS — R0602 Shortness of breath: Secondary | ICD-10-CM | POA: Diagnosis not present

## 2017-07-07 DIAGNOSIS — R079 Chest pain, unspecified: Secondary | ICD-10-CM | POA: Diagnosis not present

## 2017-07-07 DIAGNOSIS — I200 Unstable angina: Secondary | ICD-10-CM

## 2017-07-07 LAB — EXERCISE TOLERANCE TEST
CHL CUP STRESS STAGE 1 SPEED: 0 mph
CHL CUP STRESS STAGE 2 GRADE: 0 %
CHL CUP STRESS STAGE 2 SPEED: 0 mph
CHL CUP STRESS STAGE 3 GRADE: 0 %
CHL CUP STRESS STAGE 3 HR: 81 {beats}/min
CHL CUP STRESS STAGE 4 SPEED: 1 mph
CHL CUP STRESS STAGE 5 DBP: 76 mmHg
CHL CUP STRESS STAGE 5 SBP: 160 mmHg
CHL CUP STRESS STAGE 7 GRADE: 14 %
CHL CUP STRESS STAGE 7 SPEED: 3.4 mph
CHL CUP STRESS STAGE 8 DBP: 72 mmHg
CHL CUP STRESS STAGE 8 GRADE: 0 %
CHL CUP STRESS STAGE 8 SBP: 138 mmHg
CHL CUP STRESS STAGE 8 SPEED: 0 mph
CHL CUP STRESS STAGE 9 GRADE: 0 %
CHL CUP STRESS STAGE 9 SPEED: 0 mph
Estimated workload: 10 METS
Exercise duration (min): 8 min
Exercise duration (sec): 0 s
MPHR: 160 {beats}/min
Peak HR: 160 {beats}/min
Percent HR: 100 %
Percent of predicted max HR: 100 %
RPE: 18
Rest HR: 74 {beats}/min
Stage 1 Grade: 0 %
Stage 1 HR: 81 {beats}/min
Stage 2 DBP: 90 mmHg
Stage 2 HR: 72 {beats}/min
Stage 2 SBP: 136 mmHg
Stage 3 Speed: 1 mph
Stage 4 Grade: 0.1 %
Stage 4 HR: 81 {beats}/min
Stage 5 Grade: 10 %
Stage 5 HR: 115 {beats}/min
Stage 5 Speed: 1.7 mph
Stage 6 DBP: 76 mmHg
Stage 6 Grade: 12 %
Stage 6 HR: 144 {beats}/min
Stage 6 SBP: 175 mmHg
Stage 6 Speed: 2.5 mph
Stage 7 HR: 160 {beats}/min
Stage 8 HR: 130 {beats}/min
Stage 9 DBP: 70 mmHg
Stage 9 HR: 91 {beats}/min
Stage 9 SBP: 127 mmHg

## 2017-07-07 LAB — LIPID PANEL
CHOL/HDL RATIO: 2.5 ratio (ref 0.0–4.4)
CHOLESTEROL TOTAL: 191 mg/dL (ref 100–199)
HDL: 76 mg/dL (ref >39)
LDL Calculated: 105 mg/dL — ABNORMAL HIGH (ref 0–99)
Triglycerides: 49 mg/dL (ref 0–149)
VLDL Cholesterol Cal: 10 mg/dL (ref 5–40)

## 2017-07-07 NOTE — Progress Notes (Signed)
Lipid

## 2017-07-15 ENCOUNTER — Telehealth: Payer: Self-pay | Admitting: Cardiology

## 2017-07-15 NOTE — Telephone Encounter (Signed)
Sent via Epic as requested. Left message sending and that [place] in care everywhere

## 2017-07-15 NOTE — Telephone Encounter (Signed)
New message  Jaclyn Spencer is calling from Dr.Fullbrights office for the office notes for the pt ov 07/02/2017 with Dr.Hochrein

## 2017-08-12 ENCOUNTER — Telehealth: Payer: Self-pay | Admitting: Cardiology

## 2017-08-12 NOTE — Telephone Encounter (Signed)
Tried to call again tonight and got a voicemail again.

## 2017-08-12 NOTE — Telephone Encounter (Signed)
New message    Patient calling from results from 07/07/17. Please call

## 2017-08-12 NOTE — Telephone Encounter (Signed)
Spoke with pt she stated cell # [phone] is most accessible to reach her.

## 2017-08-12 NOTE — Telephone Encounter (Signed)
Alanson Aly message    Patient calling

## 2017-08-12 NOTE — Telephone Encounter (Signed)
I will try to call again.  I have called and left several messages.  I have asked for a number that is more accessible.

## 2017-08-25 ENCOUNTER — Telehealth: Payer: Self-pay | Admitting: Cardiology

## 2017-08-25 NOTE — Telephone Encounter (Signed)
Returned the call to the patient. She stated she just wanted a follow up with Dr. Percival Spencer. She has an appointment on 1/29.

## 2017-08-25 NOTE — Telephone Encounter (Signed)
New message  Pt said she just want an ov   Informed her that she was just seen last month and that las ov note instructed as needed follow up asked her if she was having any issues Pt verbalized that she is having pain in her, back No sob  And I tried to get as much information out of her but she just wanted appt. I could not do a smart phrase because she dont want a call back just ov.

## 2017-10-11 ENCOUNTER — Telehealth: Payer: Self-pay | Admitting: *Deleted

## 2017-10-11 DIAGNOSIS — R931 Abnormal findings on diagnostic imaging of heart and coronary circulation: Secondary | ICD-10-CM

## 2017-10-11 NOTE — Telephone Encounter (Signed)
-----   Message from Minus Breeding, MD sent at 10/08/2017  6:17 PM EST ----- Finally discussed with the patient.  No change in therapy.  Continue risk reduction.  I suggested follow up coronary calcium score in 5 years.  I would consider repeat POET (Plain Old Exercise Treadmill) in 2 years.  Send results to Robyne Peers, MD

## 2017-10-11 NOTE — Telephone Encounter (Signed)
Dr Percival Spanish discussed result with pt, GXT ordered to get done in 2 years.

## 2017-10-26 ENCOUNTER — Ambulatory Visit: Payer: BLUE CROSS/BLUE SHIELD | Admitting: Cardiology

## 2018-07-19 DIAGNOSIS — D1801 Hemangioma of skin and subcutaneous tissue: Secondary | ICD-10-CM | POA: Diagnosis not present

## 2018-07-19 DIAGNOSIS — L814 Other melanin hyperpigmentation: Secondary | ICD-10-CM | POA: Diagnosis not present

## 2018-07-19 DIAGNOSIS — Z85828 Personal history of other malignant neoplasm of skin: Secondary | ICD-10-CM | POA: Diagnosis not present

## 2018-10-02 ENCOUNTER — Encounter: Payer: Self-pay | Admitting: Gastroenterology

## 2019-11-10 ENCOUNTER — Ambulatory Visit: Payer: BC Managed Care – PPO | Attending: Internal Medicine

## 2019-11-10 DIAGNOSIS — Z23 Encounter for immunization: Secondary | ICD-10-CM | POA: Insufficient documentation

## 2019-11-10 NOTE — Progress Notes (Signed)
   Covid-19 Vaccination Clinic  Name:  Jaclyn Spencer    MRN: [id number] DOB: May 31, 1956  11/10/2019  [name] was observed post Covid-19 immunization for 15 minutes without incidence. She was provided with Vaccine Information Sheet and instruction to access the V-Safe system.   [name] was instructed to call 911 with any severe reactions post vaccine: Marland Kitchen Difficulty breathing  . Swelling of your face and throat  . A fast heartbeat  . A bad rash all over your body  . Dizziness and weakness    Immunizations Administered    Name Date Dose VIS Date Route   Pfizer COVID-19 Vaccine 11/10/2019  5:49 PM 0.3 mL 09/08/2019 Intramuscular   Manufacturer: [place]   Lot: [id number]   [place]: [id number]

## 2019-11-13 DIAGNOSIS — D1801 Hemangioma of skin and subcutaneous tissue: Secondary | ICD-10-CM | POA: Diagnosis not present

## 2019-11-13 DIAGNOSIS — L218 Other seborrheic dermatitis: Secondary | ICD-10-CM | POA: Diagnosis not present

## 2019-11-13 DIAGNOSIS — L905 Scar conditions and fibrosis of skin: Secondary | ICD-10-CM | POA: Diagnosis not present

## 2019-11-13 DIAGNOSIS — D225 Melanocytic nevi of trunk: Secondary | ICD-10-CM | POA: Diagnosis not present

## 2019-12-03 ENCOUNTER — Ambulatory Visit: Payer: BC Managed Care – PPO | Attending: Internal Medicine

## 2019-12-03 DIAGNOSIS — Z23 Encounter for immunization: Secondary | ICD-10-CM | POA: Insufficient documentation

## 2019-12-03 NOTE — Progress Notes (Signed)
   Covid-19 Vaccination Clinic  Name:  KAHLEN DEDMON    MRN: [id number] DOB: Jan 23, 1956  12/03/2019  Ms. Verhalen was observed post Covid-19 immunization for 15 minutes without incident. She was provided with Vaccine Information Sheet and instruction to access the V-Safe system.   Ms. Mandracchia was instructed to call 911 with any severe reactions post vaccine: Marland Kitchen Difficulty breathing  . Swelling of face and throat  . A fast heartbeat  . A bad rash all over body  . Dizziness and weakness   Immunizations Administered    Name Date Dose VIS Date Route   Pfizer COVID-19 Vaccine 12/03/2019  9:03 AM 0.3 mL 09/08/2019 Intramuscular   Manufacturer: [place]   Lot: [id number]   [place]: [id number]

## 2020-03-15 ENCOUNTER — Ambulatory Visit (INDEPENDENT_AMBULATORY_CARE_PROVIDER_SITE_OTHER): Payer: BC Managed Care – PPO | Admitting: Family Medicine

## 2020-03-15 ENCOUNTER — Other Ambulatory Visit: Payer: Self-pay

## 2020-03-15 ENCOUNTER — Encounter: Payer: Self-pay | Admitting: Family Medicine

## 2020-03-15 VITALS — BP 110/72 | HR 94 | Temp 97.6°F | Resp 12 | Ht 66.0 in | Wt 132.5 lb

## 2020-03-15 DIAGNOSIS — E7800 Pure hypercholesterolemia, unspecified: Secondary | ICD-10-CM

## 2020-03-15 DIAGNOSIS — E559 Vitamin D deficiency, unspecified: Secondary | ICD-10-CM

## 2020-03-15 DIAGNOSIS — Z0000 Encounter for general adult medical examination without abnormal findings: Secondary | ICD-10-CM

## 2020-03-15 DIAGNOSIS — Z1159 Encounter for screening for other viral diseases: Secondary | ICD-10-CM

## 2020-03-15 DIAGNOSIS — E538 Deficiency of other specified B group vitamins: Secondary | ICD-10-CM | POA: Diagnosis not present

## 2020-03-15 DIAGNOSIS — Z1329 Encounter for screening for other suspected endocrine disorder: Secondary | ICD-10-CM

## 2020-03-15 DIAGNOSIS — R1010 Upper abdominal pain, unspecified: Secondary | ICD-10-CM

## 2020-03-15 DIAGNOSIS — Z13228 Encounter for screening for other metabolic disorders: Secondary | ICD-10-CM

## 2020-03-15 DIAGNOSIS — Z130 Encounter for screening for diseases of the blood and blood-forming organs and certain disorders involving the immune mechanism: Secondary | ICD-10-CM

## 2020-03-15 DIAGNOSIS — Z1211 Encounter for screening for malignant neoplasm of colon: Secondary | ICD-10-CM

## 2020-03-15 LAB — CBC
HCT: 41 % (ref 36.0–46.0)
Hemoglobin: 14 g/dL (ref 12.0–15.0)
MCHC: 34 g/dL (ref 30.0–36.0)
MCV: 94.9 fl (ref 78.0–100.0)
Platelets: 230 10*3/uL (ref 150.0–400.0)
RBC: 4.32 Mil/uL (ref 3.87–5.11)
RDW: 13.4 % (ref 11.5–15.5)
WBC: 5.8 10*3/uL (ref 4.0–10.5)

## 2020-03-15 LAB — COMPREHENSIVE METABOLIC PANEL
ALT: 10 U/L (ref 0–35)
AST: 16 U/L (ref 0–37)
Albumin: 4.6 g/dL (ref 3.5–5.2)
Alkaline Phosphatase: 43 U/L (ref 39–117)
BUN: 11 mg/dL (ref 6–23)
CO2: 28 mEq/L (ref 19–32)
Calcium: 9.5 mg/dL (ref 8.4–10.5)
Chloride: 105 mEq/L (ref 96–112)
Creatinine, Ser: 0.64 mg/dL (ref 0.40–1.20)
GFR: 93.56 mL/min (ref >60.00)
Glucose, Bld: 99 mg/dL (ref 70–99)
Potassium: 4.1 mEq/L (ref 3.5–5.1)
Sodium: 140 mEq/L (ref 135–145)
Total Bilirubin: 0.7 mg/dL (ref 0.2–1.2)
Total Protein: 7 g/dL (ref 6.0–8.3)

## 2020-03-15 LAB — LIPID PANEL
Cholesterol: 237 mg/dL — ABNORMAL HIGH (ref 0–200)
HDL: 81.8 mg/dL (ref >39.00)
LDL Cholesterol: 142 mg/dL — ABNORMAL HIGH (ref 0–99)
NonHDL: 154.72
Total CHOL/HDL Ratio: 3
Triglycerides: 65 mg/dL (ref 0.0–149.0)
VLDL: 13 mg/dL (ref 0.0–40.0)

## 2020-03-15 LAB — VITAMIN D 25 HYDROXY (VIT D DEFICIENCY, FRACTURES): VITD: 30.57 ng/mL (ref 30.00–100.00)

## 2020-03-15 LAB — VITAMIN B12: Vitamin B-12: 425 pg/mL (ref 211–911)

## 2020-03-15 NOTE — Progress Notes (Signed)
HPI: [name] is a 64 y.o. female, who is here today to establish care.  Former PCP: Dr Ruben Gottron. Last preventive routine visit: 2013  Chronic medical problems: Season allergies, HLD, "digestive issues",hemorrhoids,B12 def,Vit D def,nose BCC, and OA among some.  Reporting upper abdominal burning pain 10/10,not radiated. It happened 6 months ago. +Heartburn. Pain has resolved. No changes in bowel movement, Hx of loose stools x 1 daily.  Gluten free diet helped with GI symptoms.  Vit D deficiency,she takes Vit D3 5000 U 3 times per week. Concerns today: She would like this visit to be a CPE. She wants blood work done today.  Established with gyn. Last pap smear: She is not sure.  She lives with her husband. Follows a healthful diet in general. Exercises regularly: Yoga and walks a few times per week.  Former smoker, quit in the 1980's. Drinks a glass of wine weekly.  Mammogram: 06/2014. Colonoscopy: 2009.  Immunization History  Administered Date(s) Administered  . PFIZER SARS-COV-2 Vaccination 11/10/2019, 12/03/2019    Review of Systems  Constitutional: Negative for appetite change, fatigue and fever.  HENT: Negative for dental problem, hearing loss, mouth sores and sore throat.   Eyes: Negative for redness and visual disturbance.  Respiratory: Negative for cough, shortness of breath and wheezing.   Cardiovascular: Negative for chest pain and leg swelling.  Gastrointestinal: Negative for abdominal pain, nausea and vomiting.       No changes in bowel habits.  Endocrine: Negative for cold intolerance, heat intolerance, polydipsia, polyphagia and polyuria.  Genitourinary: Negative for decreased urine volume, dysuria, hematuria, vaginal bleeding and vaginal discharge.  Musculoskeletal: Positive for arthralgias. Negative for gait problem and myalgias.  Skin: Negative for color change and rash.  Allergic/Immunologic: Positive for environmental allergies.    Neurological: Negative for syncope, weakness and headaches.  Hematological: Negative for adenopathy. Does not bruise/bleed easily.  Psychiatric/Behavioral: Negative for confusion and sleep disturbance. The patient is not nervous/anxious.   All other systems reviewed and are negative. Rest see pertinent positives and negatives per HPI.  Current Outpatient Medications on File Prior to Visit  Medication Sig Dispense Refill  . azelastine (ASTELIN) 0.1 % nasal spray 3 bottles    . Chlorpheniramine Maleate (ALLERGY PO) Take 1 tablet by mouth as needed.     No current facility-administered medications on file prior to visit.   Past Medical History:  Diagnosis Date  . Abdominal pain   . Allergy    rhinitis  . Anxiety   . B12 deficiency   . Basal cell carcinoma   . Diverticulosis   . GERD (gastroesophageal reflux disease)   . Vitamin D deficiency   . Weight loss    No Known Allergies  Family History  Problem Relation Age of Onset  . Colitis Mother   . Inflammatory bowel disease Mother   . Coronary artery disease Mother 26       Stents  . Heart attack Mother   . Kidney cancer Mother   . Lung cancer Father   . Coronary artery disease Paternal Grandfather   . Stomach cancer Paternal Grandmother   . Stomach cancer Paternal Aunt   . Arthritis Sister   . Cancer Brother   . Diabetes Neg Hx   . Hyperlipidemia Neg Hx   . Hypertension Neg Hx   . Sudden death Neg Hx     Social History   Socioeconomic History  . Marital status: Married    Spouse name: Not on  file  . Number of children: 0  . Years of education: Not on file  . Highest education level: Not on file  Occupational History  . Not on file  Tobacco Use  . Smoking status: Former Smoker    Quit date: 09/28/1976    Years since quitting: 43.4  . Smokeless tobacco: Never Used  Vaping Use  . Vaping Use: Never used  Substance and Sexual Activity  . Alcohol use: Yes    Comment: 1 per day  . Drug use: No  . Sexual  activity: Not Currently  Other Topics Concern  . Not on file  Social History Narrative  . Not on file   Social Determinants of Health   Financial Resource Strain:   . Difficulty of Paying Living Expenses:   Food Insecurity:   . Worried About Charity fundraiser in the Last Year:   . Arboriculturist in the Last Year:   Transportation Needs:   . Film/video editor (Medical):   Marland Kitchen Lack of Transportation (Non-Medical):   Physical Activity:   . Days of Exercise per Week:   . Minutes of Exercise per Session:   Stress:   . Feeling of Stress :   Social Connections:   . Frequency of Communication with Friends and Family:   . Frequency of Social Gatherings with Friends and Family:   . Attends Religious Services:   . Active Member of Clubs or Organizations:   . Attends Archivist Meetings:   Marland Kitchen Marital Status:     Vitals:   03/15/20 0904  BP: 110/72  Pulse: 94  Resp: 12  Temp: 97.6 F (36.4 C)  SpO2: 97%   Wt Readings from Last 3 Encounters:  03/15/20 132 lb 8 oz (60.1 kg)  07/02/17 127 lb (57.6 kg)  08/12/16 128 lb 2 oz (58.1 kg)   Body mass index is 21.39 kg/m.  Physical Exam  Nursing note and vitals reviewed. Constitutional: She is oriented to person, place, and time. She appears well-developed. No distress.  HENT:  Head: Normocephalic and atraumatic.  Right Ear: Hearing, tympanic membrane, external ear and ear canal normal.  Left Ear: Hearing, tympanic membrane, external ear and ear canal normal.  Mouth/Throat: Uvula is midline. Mucous membranes are moist.  Eyes: Pupils are equal, round, and reactive to light. Conjunctivae are normal.  Neck: No tracheal deviation present. No thyromegaly present.  Cardiovascular: Normal rate and regular rhythm.  No murmur heard. Pulses:      Dorsalis pedis pulses are 2+ on the right side and 2+ on the left side.  Respiratory: Effort normal and breath sounds normal. No respiratory distress.  GI: Soft. She exhibits no  mass. There is no hepatomegaly. There is no abdominal tenderness.  Genitourinary:    Genitourinary Comments: Deferred to gyn.   Musculoskeletal:     Comments: No major deformity or signs of synovitis appreciated.  Lymphadenopathy:    She has no cervical adenopathy.       Right: No supraclavicular adenopathy present.       Left: No supraclavicular adenopathy present.  Neurological: She is alert and oriented to person, place, and time. No cranial nerve deficit. Gait normal.  Reflex Scores:      Bicep reflexes are 2+ on the right side and 2+ on the left side.      Patellar reflexes are 2+ on the right side and 2+ on the left side. Skin: Skin is warm. No rash noted. No erythema.  Psychiatric: Her speech is normal.  Well groomed, good eye contact.   ASSESSMENT AND PLAN:  [name] was seen today for establish care and annual exam.  Diagnoses and all orders for this visit: Orders Placed This Encounter  Procedures  . CBC  . Comprehensive metabolic panel  . Lipid panel  . Vitamin B12  . VITAMIN D 25 Hydroxy (Vit-D Deficiency, Fractures)  . Hepatitis C antibody  . Ambulatory referral to Gastroenterology   Lab Results  Component Value Date   CREATININE 0.64 03/15/2020   BUN 11 03/15/2020   NA 140 03/15/2020   K 4.1 03/15/2020   CL 105 03/15/2020   CO2 28 03/15/2020   Lab Results  Component Value Date   CHOL 237 (H) 03/15/2020   HDL 81.80 03/15/2020   LDLCALC 142 (H) 03/15/2020   LDLDIRECT 117.5 09/27/2012   TRIG 65.0 03/15/2020   CHOLHDL 3 03/15/2020   Lab Results  Component Value Date   WBC 5.8 03/15/2020   HGB 14.0 03/15/2020   HCT 41.0 03/15/2020   MCV 94.9 03/15/2020   PLT 230.0 03/15/2020   Lab Results  Component Value Date   VITAMINB12 425 03/15/2020     Routine general medical examination at a health care facility We discussed the importance of regular physical activity and healthy diet for prevention of chronic illness and/or complications. Preventive  guidelines reviewed. Vaccination up to date. She is overdue for her gyn preventive care,she is going to schedule appt with her gyn. Ca++ and vit D supplementation recommended. Next CPE in a year.  The 10-year ASCVD risk score Mikey Bussing DC Brooke Bonito., et al., 2013) is: 3%   Values used to calculate the score:     Age: 30 years     Sex: Female     Is Non-Hispanic African American: No     Diabetic: No     Tobacco smoker: No     Systolic Blood Pressure: 962 mmHg     Is BP treated: No     HDL Cholesterol: 81.8 mg/dL     Total Cholesterol: 237 mg/dL  Hypercholesterolemia Continue non pharmacologic treatment. Further recommendations according to FLP and 10 years CVD risk.  Upper abdominal pain Resolved.  Vitamin D deficiency, unspecified No changes in current management, will follow 25 OH vit Dand will give further recommendations accordingly.  B12 deficiency Further recommendations according to B12 results.  Screening for endocrine, metabolic and immunity disorder -     Comprehensive metabolic panel  Colon cancer screening -     Ambulatory referral to Gastroenterology  Encounter for HCV screening test for low risk patient -     Hepatitis C antibody   Return in 1 year (on 03/15/2021) for cpe.   Katriel Cutsforth G. Martinique, MD  [facility]. [place] office.   A few things to remember from today's visit:   Hypercholesterolemia - Plan: Comprehensive metabolic panel, Lipid panel  Upper abdominal pain - Plan: CBC, Comprehensive metabolic panel  Vitamin D deficiency, unspecified - Plan: Comprehensive metabolic panel, VITAMIN D 25 Hydroxy (Vit-D Deficiency, Fractures)  B12 deficiency - Plan: Vitamin B12  Routine general medical examination at a health care facility  Screening for endocrine, metabolic and immunity disorder - Plan: Comprehensive metabolic panel  Colon cancer screening - Plan: Ambulatory referral to Gastroenterology  Today you have you routine preventive  visit.  At least 150 minutes of moderate exercise per week, daily brisk walking for 15-30 min is a good exercise option. Healthy diet low in saturated (animal)  fats and sweets and consisting of fresh fruits and vegetables, lean meats such as fish and white chicken and whole grains.  These are some of recommendations for screening depending of age and risk factors:  - Vaccines:  Tdap vaccine every 10 years.  Shingles vaccine recommended at age 81, could be given after 64 years of age but not sure about insurance coverage.   Pneumonia vaccines: Pneumovax at 58. Sometimes Pneumovax is giving earlier if history of smoking, lung disease,diabetes,kidney disease among some.  Screening for diabetes at age 84 and every 3 years.  Cervical cancer prevention:  Pap smear starts at 64 years of age and continues periodically until 64 years old in low risk women. Pap smear every 3 years between 34 and 42 years old. Pap smear every 3-5 years between women 23 and older if pap smear negative and HPV screening negative. Set up appt with gyn.  -Breast cancer: Mammogram: There is disagreement between experts about when to start screening in low risk asymptomatic female but recent recommendations are to start screening at 24 and not later than 64 years old , every 1-2 years and after 65 yo q 2 years. Screening is recommended until 64 years old but some women can continue screening depending of healthy issues.  Colon cancer screening: Has been recently changed to 64 yo. Insurance may not cover until you are 64 years old. Screening is recommended until 64 years old.  Cholesterol disorder screening at age 64 and every 3 years. N/A  Also recommended:  1. Dental visit- Brush and floss your teeth twice daily; visit your dentist twice a year. 2. Eye doctor- Get an eye exam at least every 2 years. 3. Helmet use- Always wear a helmet when riding a bicycle, motorcycle, rollerblading or skateboarding. 4. Safe sex-  If you may be exposed to sexually transmitted infections, use a condom. 5. Seat belts- Seat belts can save your live; always wear one. 6. Smoke/Carbon Monoxide detectors- These detectors need to be installed on the appropriate level of your home. Replace batteries at least once a year. 7. Skin cancer- When out in the sun please cover up and use sunscreen 15 SPF or higher. 8. Violence- If anyone is threatening or hurting you, please tell your healthcare provider.  9. Drink alcohol in moderation- Limit alcohol intake to one drink or less per day. Never drink and drive. 10. Calcium supplementation 1000 to 1200 mg daily, ideally through your diet.  Vitamin D supplementation 800 units daily.  Please be sure medication list is accurate. If a new problem present, please set up appointment sooner than planned today.

## 2020-03-15 NOTE — Patient Instructions (Addendum)
A few things to remember from today's visit:   Hypercholesterolemia - Plan: Comprehensive metabolic panel, Lipid panel  Upper abdominal pain - Plan: CBC, Comprehensive metabolic panel  Vitamin D deficiency, unspecified - Plan: Comprehensive metabolic panel, VITAMIN D 25 Hydroxy (Vit-D Deficiency, Fractures)  B12 deficiency - Plan: Vitamin B12  Routine general medical examination at a health care facility  Screening for endocrine, metabolic and immunity disorder - Plan: Comprehensive metabolic panel  Colon cancer screening - Plan: Ambulatory referral to Gastroenterology  Today you have you routine preventive visit.  At least 150 minutes of moderate exercise per week, daily brisk walking for 15-30 min is a good exercise option. Healthy diet low in saturated (animal) fats and sweets and consisting of fresh fruits and vegetables, lean meats such as fish and white chicken and whole grains.  These are some of recommendations for screening depending of age and risk factors:  - Vaccines:  Tdap vaccine every 10 years.  Shingles vaccine recommended at age 100, could be given after 64 years of age but not sure about insurance coverage.   Pneumonia vaccines: Pneumovax at 69. Sometimes Pneumovax is giving earlier if history of smoking, lung disease,diabetes,kidney disease among some.  Screening for diabetes at age 26 and every 3 years.  Cervical cancer prevention:  Pap smear starts at 64 years of age and continues periodically until 64 years old in low risk women. Pap smear every 3 years between 4 and 33 years old. Pap smear every 3-5 years between women 76 and older if pap smear negative and HPV screening negative. Set up appt with gyn.  -Breast cancer: Mammogram: There is disagreement between experts about when to start screening in low risk asymptomatic female but recent recommendations are to start screening at 26 and not later than 64 years old , every 1-2 years and after 64 yo q 2  years. Screening is recommended until 64 years old but some women can continue screening depending of healthy issues.  Colon cancer screening: Has been recently changed to 64 yo. Insurance may not cover until you are 64 years old. Screening is recommended until 64 years old.  Cholesterol disorder screening at age 59 and every 3 years. N/A  Also recommended:  1. Dental visit- Brush and floss your teeth twice daily; visit your dentist twice a year. 2. Eye doctor- Get an eye exam at least every 2 years. 3. Helmet use- Always wear a helmet when riding a bicycle, motorcycle, rollerblading or skateboarding. 4. Safe sex- If you may be exposed to sexually transmitted infections, use a condom. 5. Seat belts- Seat belts can save your live; always wear one. 6. Smoke/Carbon Monoxide detectors- These detectors need to be installed on the appropriate level of your home. Replace batteries at least once a year. 7. Skin cancer- When out in the sun please cover up and use sunscreen 15 SPF or higher. 8. Violence- If anyone is threatening or hurting you, please tell your healthcare provider.  9. Drink alcohol in moderation- Limit alcohol intake to one drink or less per day. Never drink and drive. 10. Calcium supplementation 1000 to 1200 mg daily, ideally through your diet.  Vitamin D supplementation 800 units daily.  Please be sure medication list is accurate. If a new problem present, please set up appointment sooner than planned today.

## 2020-03-18 LAB — HEPATITIS C ANTIBODY
Hepatitis C Ab: NONREACTIVE
SIGNAL TO CUT-OFF: 0.01 (ref <1.00)

## 2020-03-29 ENCOUNTER — Encounter: Payer: Self-pay | Admitting: Gastroenterology

## 2020-04-29 ENCOUNTER — Encounter: Payer: Self-pay | Admitting: *Deleted

## 2020-05-29 ENCOUNTER — Encounter: Payer: BC Managed Care – PPO | Admitting: Gastroenterology

## 2020-06-18 DIAGNOSIS — L570 Actinic keratosis: Secondary | ICD-10-CM | POA: Diagnosis not present

## 2020-06-20 ENCOUNTER — Encounter: Payer: Self-pay | Admitting: Gastroenterology

## 2020-07-10 ENCOUNTER — Encounter: Payer: BC Managed Care – PPO | Admitting: Gastroenterology

## 2020-07-10 DIAGNOSIS — D485 Neoplasm of uncertain behavior of skin: Secondary | ICD-10-CM | POA: Diagnosis not present

## 2020-07-10 DIAGNOSIS — C44329 Squamous cell carcinoma of skin of other parts of face: Secondary | ICD-10-CM | POA: Diagnosis not present

## 2020-08-05 DIAGNOSIS — C44329 Squamous cell carcinoma of skin of other parts of face: Secondary | ICD-10-CM | POA: Diagnosis not present

## 2020-08-14 ENCOUNTER — Encounter: Payer: Self-pay | Admitting: Gastroenterology

## 2020-08-14 ENCOUNTER — Ambulatory Visit (INDEPENDENT_AMBULATORY_CARE_PROVIDER_SITE_OTHER): Payer: BC Managed Care – PPO | Admitting: Gastroenterology

## 2020-08-14 VITALS — BP 122/64 | HR 88 | Ht 66.0 in | Wt 129.4 lb

## 2020-08-14 DIAGNOSIS — Z1211 Encounter for screening for malignant neoplasm of colon: Secondary | ICD-10-CM | POA: Diagnosis not present

## 2020-08-14 DIAGNOSIS — K589 Irritable bowel syndrome without diarrhea: Secondary | ICD-10-CM

## 2020-08-14 DIAGNOSIS — R1310 Dysphagia, unspecified: Secondary | ICD-10-CM | POA: Diagnosis not present

## 2020-08-14 DIAGNOSIS — K219 Gastro-esophageal reflux disease without esophagitis: Secondary | ICD-10-CM

## 2020-08-14 DIAGNOSIS — R109 Unspecified abdominal pain: Secondary | ICD-10-CM | POA: Diagnosis not present

## 2020-08-14 DIAGNOSIS — Z8719 Personal history of other diseases of the digestive system: Secondary | ICD-10-CM

## 2020-08-14 MED ORDER — DICYCLOMINE HCL 10 MG PO CAPS: 10.0000 mg | ORAL_CAPSULE | Freq: Three times a day (TID) | ORAL | 1 refills | Status: DC | PRN

## 2020-08-14 MED ORDER — SUPREP BOWEL PREP KIT 17.5-3.13-1.6 GM/177ML PO SOLN: ORAL | 0 refills | Status: DC

## 2020-08-14 MED ORDER — HYDROCORTISONE (PERIANAL) 2.5 % EX CREA: 1.0000 "application " | TOPICAL_CREAM | Freq: Every evening | CUTANEOUS | 1 refills | Status: DC | PRN

## 2020-08-14 MED ORDER — FAMOTIDINE 40 MG PO TABS: 40.0000 mg | ORAL_TABLET | Freq: Every day | ORAL | 3 refills | Status: DC

## 2020-08-14 NOTE — Progress Notes (Signed)
Jaclyn Spencer    [id number]    1955/10/20  Primary Care Physician:Jordan, Malka So, MD  Referring Physician: Martinique, Betty G, MD [address] [place],  [place] [address]   Chief complaint: Dysphagia, GERD, abdominal pain, hemorrhoids  HPI:  64 year old very pleasant female here to reestablish care for complaints of dysphagia, GERD, abdominal pain and hemorrhoids Last office visit in November 2017.  Previously she was followed by Dr. Olevia Perches  She has been having worsening dysphagia to pills, she always had difficulty with large pills but she feels it is getting worse even relatively smaller size tablets are getting hung up.  Denies any difficulty swallowing liquids and no history of food impactions.  She has intermittent reflux episodes, upper left side abdominal pain and also epigastric pain, is worse when she lays down at night.  She has taken over-the-counter antacids like Tums with some relief.  She has intermittent cough  She develops spasms or cramping throughout the abdomen, has intermittent constipation and also itching, burning from hemorrhoids  Denies any nausea, vomiting, melena or bright red blood per rectum  Colonoscopy November 2009 showed sigmoid diverticulosis and internal hemorrhoids otherwise normal   Outpatient Encounter Medications as of 08/14/2020  Medication Sig  . azelastine (ASTELIN) 0.1 % nasal spray Place 1 spray into both nostrils as needed.   . Chlorpheniramine Maleate (ALLERGY PO) Take 1 tablet by mouth as needed.   No facility-administered encounter medications on file as of 08/14/2020.    Allergies as of 08/14/2020  . (No Known Allergies)    Past Medical History:  Diagnosis Date  . Abdominal pain   . Allergy    rhinitis  . Anxiety   . B12 deficiency   . Basal cell carcinoma   . Diverticulosis   . GERD (gastroesophageal reflux disease)   . Vitamin D deficiency   . Weight loss     Past Surgical History:  Procedure  Laterality Date  . MOHS SURGERY     nose    Family History  Problem Relation Age of Onset  . Colitis Mother   . Inflammatory bowel disease Mother   . Coronary artery disease Mother 91       Stents  . Heart attack Mother   . Kidney cancer Mother   . Lung cancer Father   . Coronary artery disease Paternal Grandfather   . Stomach cancer Paternal Grandmother   . Stomach cancer Paternal Aunt   . Arthritis Sister   . Cancer Brother        melanoma  . Diabetes Neg Hx   . Hyperlipidemia Neg Hx   . Hypertension Neg Hx   . Sudden death Neg Hx   . Esophageal cancer Neg Hx   . Pancreatic cancer Neg Hx   . Liver disease Neg Hx   . Colon cancer Neg Hx     Social History   Socioeconomic History  . Marital status: Married    Spouse name: Not on file  . Number of children: 0  . Years of education: Not on file  . Highest education level: Not on file  Occupational History  . Not on file  Tobacco Use  . Smoking status: Former Smoker    Types: Cigarettes    Quit date: 09/28/1976    Years since quitting: 43.9  . Smokeless tobacco: Never Used  Vaping Use  . Vaping Use: Never used  Substance and Sexual Activity  . Alcohol  use: Yes    Comment: occ  . Drug use: No  . Sexual activity: Not Currently  Other Topics Concern  . Not on file  Social History Narrative  . Not on file   Social Determinants of Health   Financial Resource Strain:   . Difficulty of Paying Living Expenses: Not on file  Food Insecurity:   . Worried About Charity fundraiser in the Last Year: Not on file  . Ran Out of Food in the Last Year: Not on file  Transportation Needs:   . Lack of Transportation (Medical): Not on file  . Lack of Transportation (Non-Medical): Not on file  Physical Activity:   . Days of Exercise per Week: Not on file  . Minutes of Exercise per Session: Not on file  Stress:   . Feeling of Stress : Not on file  Social Connections:   . Frequency of Communication with Friends and Family:  Not on file  . Frequency of Social Gatherings with Friends and Family: Not on file  . Attends Religious Services: Not on file  . Active Member of Clubs or Organizations: Not on file  . Attends Archivist Meetings: Not on file  . Marital Status: Not on file  Intimate Partner Violence:   . Fear of Current or Ex-Partner: Not on file  . Emotionally Abused: Not on file  . Physically Abused: Not on file  . Sexually Abused: Not on file      Review of systems: All other review of systems negative except as mentioned in the HPI.   Physical Exam: Vitals:   08/14/20 0852  BP: 122/64  Pulse: 88  SpO2: 99%   Body mass index is 20.89 kg/m. Gen:      No acute distress HEENT:  sclera anicteric Abd:      soft, non-tender; no palpable masses, no distension Ext:    No edema Neuro: alert and oriented x 3 Psych: normal mood and affect  Data Reviewed:  Reviewed labs, radiology imaging, old records and pertinent past GI work up   Assessment and Plan/Recommendations: 64 year old very pleasant female with chronic GERD, pill dysphagia, abdominal spasms, generalized abdominal discomfort and symptomatic hemorrhoids  GERD: Mostly nocturnal symptoms, start Pepcid 40 mg daily at bedtime as needed.   Use Gaviscon after meals as needed for breakthrough symptoms Antireflux measures and lifestyle modifications  Pill dysphagia.  Will need to exclude esophageal diverticula, peptic stricture, erosive esophagitis or neoplastic lesion Schedule for EGD for further evaluation  She is past due for colorectal cancer screening, will schedule for colonoscopy along with EGD The risks and benefits as well as alternatives of endoscopic procedure(s) have been discussed and reviewed. All questions answered. The patient agrees to proceed.  Abdominal spasm and cramping likely secondary to IBS, also has history of sigmoid diverticulosis Trial of dicyclomine 10 mg every 8 hours as needed  Symptomatic  hemorrhoids: Okay to use Anusol cream per rectum at bedtime as needed Will consider hemorrhoidal band ligation at follow-up visit after colonoscopy based on findings during colonoscopy  Return in 4 to 6 months or sooner if needed   The patient was provided an opportunity to ask questions and all were answered. The patient agreed with the plan and demonstrated an understanding of the instructions.  Damaris Hippo , MD    CC: Martinique, Betty G, MD

## 2020-08-14 NOTE — Patient Instructions (Addendum)
You have been scheduled for an endoscopy and colonoscopy. Please follow the written instructions given to you at your visit today. Please pick up your prep supplies at the pharmacy within the next 1-3 days. If you use inhalers (even only as needed), please bring them with you on the day of your procedure.   If you are age 64 or older, your body mass index should be between 23-30. Your Body mass index is 20.89 kg/m. If this is out of the aforementioned range listed, please consider follow up with your Primary Care Provider.  If you are age 65 or younger, your body mass index should be between 19-25. Your Body mass index is 20.89 kg/m. If this is out of the aformentioned range listed, please consider follow up with your Primary Care Provider.    Due to recent changes in healthcare laws, you may see the results of your imaging and laboratory studies on MyChart before your provider has had a chance to review them.  We understand that in some cases there may be results that are confusing or concerning to you. Not all laboratory results come back in the same time frame and the provider may be waiting for multiple results in order to interpret others.  Please give [place] 48 hours in order for your provider to thoroughly review all the results before contacting the office for clarification of your results.   We will send Pepcid, dicyclomine, Anusol Cream  to your pharmacy  Take Gaviscon 1 tablet three times a day after meals as needed  I appreciate the  opportunity to care for you  Thank You   Harl Bowie , MD

## 2020-10-02 ENCOUNTER — Other Ambulatory Visit: Payer: BC Managed Care – PPO

## 2020-10-07 ENCOUNTER — Other Ambulatory Visit: Payer: BC Managed Care – PPO

## 2020-10-10 ENCOUNTER — Ambulatory Visit (AMBULATORY_SURGERY_CENTER): Payer: BC Managed Care – PPO | Admitting: Gastroenterology

## 2020-10-10 ENCOUNTER — Encounter: Payer: Self-pay | Admitting: Gastroenterology

## 2020-10-10 ENCOUNTER — Other Ambulatory Visit: Payer: Self-pay

## 2020-10-10 VITALS — BP 121/67 | HR 74 | Temp 96.8°F | Resp 18 | Ht 66.0 in | Wt 129.0 lb

## 2020-10-10 DIAGNOSIS — D120 Benign neoplasm of cecum: Secondary | ICD-10-CM

## 2020-10-10 DIAGNOSIS — Z1211 Encounter for screening for malignant neoplasm of colon: Secondary | ICD-10-CM

## 2020-10-10 DIAGNOSIS — R1310 Dysphagia, unspecified: Secondary | ICD-10-CM | POA: Diagnosis not present

## 2020-10-10 DIAGNOSIS — K635 Polyp of colon: Secondary | ICD-10-CM

## 2020-10-10 DIAGNOSIS — K621 Rectal polyp: Secondary | ICD-10-CM

## 2020-10-10 DIAGNOSIS — K589 Irritable bowel syndrome without diarrhea: Secondary | ICD-10-CM

## 2020-10-10 DIAGNOSIS — D128 Benign neoplasm of rectum: Secondary | ICD-10-CM

## 2020-10-10 DIAGNOSIS — D129 Benign neoplasm of anus and anal canal: Secondary | ICD-10-CM

## 2020-10-10 HISTORY — PX: COLONOSCOPY: SHX174

## 2020-10-10 MED ORDER — SODIUM CHLORIDE 0.9 % IV SOLN
500.0000 mL | Freq: Once | INTRAVENOUS | Status: DC
Start: 2020-10-10 — End: 2020-10-10

## 2020-10-10 NOTE — Progress Notes (Signed)
Vs by HC.  Pt had previsit and states no changes since that time.

## 2020-10-10 NOTE — Progress Notes (Signed)
pt tolerated well. VSS. awake and to recovery. Report given to RN. Bite block used without trauma. 

## 2020-10-10 NOTE — Patient Instructions (Signed)
HANDOUTS PROVIDED ON: POLYPS, DIVERTICULOSIS, & HEMORRHOIDS  The polyps removed today have been sent for pathology.  The results can take 1-3 weeks to receive.  When your next colonoscopy should occur will be based on the pathology results.    You may resume your previous diet and medication schedule.  Dr. Woodward Ku office will contact you to set up the Barium Swallow Study.  Thank you for allowing [place] to care for you today!!!   YOU HAD AN ENDOSCOPIC PROCEDURE TODAY AT [facility]:   Refer to the procedure report that was given to you for any specific questions about what was found during the examination.  If the procedure report does not answer your questions, please call your gastroenterologist to clarify.  If you requested that your care partner not be given the details of your procedure findings, then the procedure report has been included in a sealed envelope for you to review at your convenience later.  YOU SHOULD EXPECT: Some feelings of bloating in the abdomen. Passage of more gas than usual.  Walking can help get rid of the air that was put into your GI tract during the procedure and reduce the bloating. If you had a lower endoscopy (such as a colonoscopy or flexible sigmoidoscopy) you may notice spotting of blood in your stool or on the toilet paper. If you underwent a bowel prep for your procedure, you may not have a normal bowel movement for a few days.  Please Note:  You might notice some irritation and congestion in your nose or some drainage.  This is from the oxygen used during your procedure.  There is no need for concern and it should clear up in a day or so.  SYMPTOMS TO REPORT IMMEDIATELY:   Following lower endoscopy (colonoscopy or flexible sigmoidoscopy):  Excessive amounts of blood in the stool  Significant tenderness or worsening of abdominal pains  Swelling of the abdomen that is new, acute  Fever of 100F or higher  For urgent or emergent issues, a  gastroenterologist can be reached at any hour by calling [phone]. Do not use MyChart messaging for urgent concerns.    DIET:  We do recommend a small meal at first, but then you may proceed to your regular diet.  Drink plenty of fluids but you should avoid alcoholic beverages for 24 hours.  ACTIVITY:  You should plan to take it easy for the rest of today and you should NOT DRIVE or use heavy machinery until tomorrow (because of the sedation medicines used during the test).    FOLLOW UP: Our staff will call the number listed on your records Monday morning between 7:15 am and 8:15 am to check on you and address any questions or concerns that you may have regarding the information given to you following your procedure. If we do not reach you, we will leave a message.  We will attempt to reach you two times.  During this call, we will ask if you have developed any symptoms of COVID 19. If you develop any symptoms (ie: fever, flu-like symptoms, shortness of breath, cough etc.) before then, please call [phone].  If you test positive for Covid 19 in the 2 weeks post procedure, please call and report this information to [place].    If any biopsies were taken you will be contacted by phone or by letter within the next 1-3 weeks.  Please call us at [phone] if you have not heard about the biopsies  in 3 weeks.    SIGNATURES/CONFIDENTIALITY: You and/or your care partner have signed paperwork which will be entered into your electronic medical record.  These signatures attest to the fact that that the information above on your After Visit Summary has been reviewed and is understood.  Full responsibility of the confidentiality of this discharge information lies with you and/or your care-partner.

## 2020-10-10 NOTE — Op Note (Signed)
[facility] Patient Name: Andrean Hathway Procedure Date: 10/10/2020 8:44 AM MRN: [id number] Endoscopist: Mauri Pole , MD Age: 65 Referring MD:  Date of Birth: [birth year] Gender: Female Account #: [id number] Procedure:                Upper GI endoscopy Indications:              Dysphagia Medicines:                Monitored Anesthesia Care Procedure:                Pre-Anesthesia Assessment:                           - Prior to the procedure, a History and Physical                            was performed, and patient medications and                            allergies were reviewed. The patient's tolerance of                            previous anesthesia was also reviewed. The risks                            and benefits of the procedure and the sedation                            options and risks were discussed with the patient.                            All questions were answered, and informed consent                            was obtained. Prior Anticoagulants: The patient has                            taken no previous anticoagulant or antiplatelet                            agents. ASA Grade Assessment: II - A patient with                            mild systemic disease. After reviewing the risks                            and benefits, the patient was deemed in                            satisfactory condition to undergo the procedure.                           After obtaining informed consent, the endoscope was  passed under direct vision. Throughout the                            procedure, the patient's blood pressure, pulse, and                            oxygen saturations were monitored continuously. The                            Endoscope was introduced through the mouth, with                            the intention of advancing to the esophagus. The                            scope was advanced to the hypopharynx before  the                            procedure was aborted. Medications were given. The                            upper GI endoscopy was performed with moderate                            difficulty due to stenosis. Scope In: Scope Out: Findings:                 The examined portions of the nasopharynx,                            oropharynx and larynx were normal. Scope couldnt be                            advanced through UES, possible spasm vs extrinsic                            compression from osteophyte or esophageal web.                            Procedure was aborted. Complications:            No immediate complications. Estimated Blood Loss:     Estimated blood loss: none. Impression:               - The examined portions of the nasopharynx,                            oropharynx and larynx were normal.                           - No specimens collected. Recommendation:           - Resume previous diet.                           - Continue present medications.                           -  Perform a barium swallow using barium in liquid                            and tablet form at appointment to be scheduled.                           - Return to GI office after barium swallow study. Mauri Pole, MD 10/10/2020 9:24:31 AM This report has been signed electronically.

## 2020-10-10 NOTE — Op Note (Signed)
[facility] Patient Name: Jaclyn Spencer Procedure Date: 10/10/2020 8:44 AM MRN: [id number] Endoscopist: Mauri Pole , MD Age: 65 Referring MD:  Date of Birth: September 28, 1956 Gender: Female Account #: [id number] Procedure:                Colonoscopy Indications:              Screening for colorectal malignant neoplasm Medicines:                Monitored Anesthesia Care Procedure:                Pre-Anesthesia Assessment:                           - Prior to the procedure, a History and Physical                            was performed, and patient medications and                            allergies were reviewed. The patient's tolerance of                            previous anesthesia was also reviewed. The risks                            and benefits of the procedure and the sedation                            options and risks were discussed with the patient.                            All questions were answered, and informed consent                            was obtained. Prior Anticoagulants: The patient has                            taken no previous anticoagulant or antiplatelet                            agents. ASA Grade Assessment: II - A patient with                            mild systemic disease. After reviewing the risks                            and benefits, the patient was deemed in                            satisfactory condition to undergo the procedure.                           After obtaining informed consent, the colonoscope  was passed under direct vision. Throughout the                            procedure, the patient's blood pressure, pulse, and                            oxygen saturations were monitored continuously. The                            Olympus PCF-H190DL (#[id number]) Colonoscope was                            introduced through the anus and advanced to the the                            cecum,  identified by appendiceal orifice and                            ileocecal valve. The colonoscopy was performed                            without difficulty. The patient tolerated the                            procedure well. The quality of the bowel                            preparation was excellent. The terminal ileum,                            ileocecal valve, appendiceal orifice, and rectum                            were photographed. Scope In: 9:00:48 AM Scope Out: 9:15:47 AM Scope Withdrawal Time: 0 hours 10 minutes 2 seconds  Total Procedure Duration: 0 hours 14 minutes 59 seconds  Findings:                 The perianal and digital rectal examinations were                            normal.                           A 14 mm polyp was found in the cecum. The polyp was                            sessile. The polyp was removed with a cold snare.                            Resection and retrieval were complete.                           A 2 mm polyp was found in the rectum. The polyp was  sessile. The polyp was removed with a cold biopsy                            forceps. Resection and retrieval were complete.                           Scattered small and large-mouthed diverticula were                            found in the sigmoid colon and descending colon.                           Non-bleeding external and internal hemorrhoids were                            found during retroflexion. The hemorrhoids were                            medium-sized. Complications:            No immediate complications. Estimated Blood Loss:     Estimated blood loss was minimal. Impression:               - One 14 mm polyp in the cecum, removed with a cold                            snare. Resected and retrieved.                           - One 2 mm polyp in the rectum, removed with a cold                            biopsy forceps. Resected and retrieved.                            - Diverticulosis in the sigmoid colon and in the                            descending colon.                           - Non-bleeding external and internal hemorrhoids. Recommendation:           - Patient has a contact number available for                            emergencies. The signs and symptoms of potential                            delayed complications were discussed with the                            patient. Return to normal activities tomorrow.  Written discharge instructions were provided to the                            patient.                           - Resume previous diet.                           - Continue present medications.                           - Await pathology results.                           - Repeat colonoscopy in 3 - 5 years for                            surveillance based on pathology results. Mauri Pole, MD 10/10/2020 9:30:30 AM This report has been signed electronically.

## 2020-10-10 NOTE — Progress Notes (Signed)
Called to room to assist during endoscopic procedure.  Patient ID and intended procedure confirmed with present staff. Received instructions for my participation in the procedure from the performing physician.  

## 2020-10-12 ENCOUNTER — Other Ambulatory Visit: Payer: BC Managed Care – PPO

## 2020-10-12 DIAGNOSIS — Z20822 Contact with and (suspected) exposure to covid-19: Secondary | ICD-10-CM

## 2020-10-15 ENCOUNTER — Telehealth: Payer: Self-pay

## 2020-10-15 LAB — NOVEL CORONAVIRUS, NAA: SARS-CoV-2, NAA: NOT DETECTED

## 2020-10-15 NOTE — Telephone Encounter (Signed)
Attempted to reach pt. With follow-up call following endoscopic procedure 10/10/2020.  LM on pt. Voice mail, to call if she has any questions or concerns.

## 2020-10-16 ENCOUNTER — Telehealth: Payer: Self-pay

## 2020-10-16 ENCOUNTER — Other Ambulatory Visit: Payer: Self-pay

## 2020-10-16 DIAGNOSIS — R1310 Dysphagia, unspecified: Secondary | ICD-10-CM

## 2020-10-16 DIAGNOSIS — R634 Abnormal weight loss: Secondary | ICD-10-CM

## 2020-10-16 NOTE — Telephone Encounter (Signed)
Called the patient. No answer. Left a message on the voicemail asking she return my call about the imaging study Dr Silverio Decamp wanted her to have. Barium swallow is scheduled for 10/21/20 at [facility] in the Radiology dept.  She would need to fast starting at 8:00 am. Arrive to radiology at 10:45 am. If the weather is bad, we can reschedule. She will need a follow up appointment as well, with Dr Silverio Decamp.

## 2020-10-17 NOTE — Telephone Encounter (Signed)
Spoke with the patient and explained the imaging test. She agrees to this appointment.

## 2020-10-21 ENCOUNTER — Other Ambulatory Visit: Payer: Self-pay

## 2020-10-21 ENCOUNTER — Ambulatory Visit (HOSPITAL_COMMUNITY)
Admission: RE | Admit: 2020-10-21 | Discharge: 2020-10-21 | Disposition: A | Discharge status: Home / Self Care | Payer: BC Managed Care – PPO | Source: Ambulatory Visit | Attending: Gastroenterology | Admitting: Gastroenterology

## 2020-10-21 DIAGNOSIS — R1310 Dysphagia, unspecified: Secondary | ICD-10-CM

## 2020-10-21 DIAGNOSIS — R634 Abnormal weight loss: Secondary | ICD-10-CM

## 2020-10-21 DIAGNOSIS — K219 Gastro-esophageal reflux disease without esophagitis: Secondary | ICD-10-CM | POA: Diagnosis not present

## 2020-10-30 ENCOUNTER — Encounter: Payer: Self-pay | Admitting: Gastroenterology

## 2020-10-30 ENCOUNTER — Ambulatory Visit (INDEPENDENT_AMBULATORY_CARE_PROVIDER_SITE_OTHER): Payer: BC Managed Care – PPO | Admitting: Gastroenterology

## 2020-10-30 ENCOUNTER — Other Ambulatory Visit: Payer: Self-pay

## 2020-10-30 VITALS — BP 116/72 | HR 88 | Ht 66.0 in | Wt 128.8 lb

## 2020-10-30 DIAGNOSIS — R1319 Other dysphagia: Secondary | ICD-10-CM | POA: Diagnosis not present

## 2020-10-30 DIAGNOSIS — K220 Achalasia of cardia: Secondary | ICD-10-CM

## 2020-10-30 NOTE — Progress Notes (Signed)
Jaclyn Spencer    [id number]    1956-01-01  Primary Care Physician:Jordan, Malka So, MD  Referring Physician: Martinique, Betty G, MD [place],   [id number]   Chief complaint:  Dysphagia  HPI: 65 year old very pleasant female here for follow-up visit for dysphagia She continues to have difficulty swallowing large pills and also sometimes with food  Barium esophagram October 21, 2020: Prominent cricopharyngeal impression on posterior cervical esophagus during swallowing, 13 mm barium tablet became stuck at this level, passed after multiple large swallows of water.  No Zenker's diverticulum Cricopharyngeal achalasia  EGD 10/10/20: Upper esophageal sphincter spasm, unable to intubate esophagus  Colonoscopy 10/10/20: 14 mm polyp removed, 55mm polyp , diverticulosis and hemorrhoids.   Denies any neck trauma.  She does not take any medications or herbal supplements. Denies any nausea, vomiting, abdominal pain, melena or bright red blood per rectum      Outpatient Encounter Medications as of 10/30/2020  Medication Sig  . azelastine (ASTELIN) 0.1 % nasal spray Place 1 spray into both nostrils as needed.   . Chlorpheniramine Maleate (ALLERGY PO) Take 1 tablet by mouth as needed.  . dicyclomine (BENTYL) 10 MG capsule Take 1 capsule (10 mg total) by mouth every 8 (eight) hours as needed for spasms.  . famotidine (PEPCID) 40 MG tablet Take 1 tablet (40 mg total) by mouth at bedtime.  . hydrocortisone (ANUSOL-HC) 2.5 % rectal cream Place 1 application rectally at bedtime as needed for hemorrhoids or anal itching.   No facility-administered encounter medications on file as of 10/30/2020.    Allergies as of 10/30/2020  . (No Known Allergies)    Past Medical History:  Diagnosis Date  . Abdominal pain   . Allergy    rhinitis  . Anxiety   . B12 deficiency   . Basal cell carcinoma   . Diverticulosis   . GERD (gastroesophageal reflux disease)   . Vitamin  D deficiency   . Weight loss     Past Surgical History:  Procedure Laterality Date  . MOHS SURGERY     nose    Family History  Problem Relation Age of Onset  . Colitis Mother   . Inflammatory bowel disease Mother   . Coronary artery disease Mother 74       Stents  . Heart attack Mother   . Kidney cancer Mother   . Lung cancer Father   . Coronary artery disease Paternal Grandfather   . Stomach cancer Paternal Grandmother   . Stomach cancer Paternal Aunt   . Arthritis Sister   . Cancer Brother        melanoma  . Diabetes Neg Hx   . Hyperlipidemia Neg Hx   . Hypertension Neg Hx   . Sudden death Neg Hx   . Esophageal cancer Neg Hx   . Pancreatic cancer Neg Hx   . Liver disease Neg Hx   . Colon cancer Neg Hx     Social History   Socioeconomic History  . Marital status: Married    Spouse name: Not on file  . Number of children: 0  . Years of education: Not on file  . Highest education level: Not on file  Occupational History  . Not on file  Tobacco Use  . Smoking status: Former Smoker    Types: Cigarettes    Quit date: 09/28/1976    Years since quitting: 44.1  . Smokeless tobacco: Never Used  Vaping Use  . Vaping Use: Never used  Substance and Sexual Activity  . Alcohol use: Yes    Comment: occ  . Drug use: No  . Sexual activity: Not Currently  Other Topics Concern  . Not on file  Social History Narrative  . Not on file   Social Determinants of Health   Financial Resource Strain: Not on file  Food Insecurity: Not on file  Transportation Needs: Not on file  Physical Activity: Not on file  Stress: Not on file  Social Connections: Not on file  Intimate Partner Violence: Not on file      Review of systems: All other review of systems negative except as mentioned in the HPI.   Physical Exam: Vitals:   10/30/20 0833  BP: 116/72  Pulse: 88   Body mass index is 20.79 kg/m. Gen:      No acute distress HEENT:  sclera anicteric Abd:      soft,  non-tender; no palpable masses, no distension Ext:    No edema Neuro: alert and oriented x 3 Psych: normal mood and affect  Data Reviewed:  Reviewed labs, radiology imaging, old records and pertinent past GI work up   Assessment and Plan/Recommendations:  65 year old very pleasant female with dysphagia, prominent cricopharyngeus concerning for cricopharyngeal achalasia  Will refer to ENT for evaluation and possible Botox injection and dilation as needed  We will obtain MRI C-spine to exclude any osteophytes or nerve compression causing the cricopharyngeal achalasia  Schedule for EGD at [facility] endoscopy unit, will plan to use pediatric endoscope/5 mm scope to evaluate mid and distal esophagus to exclude any lesions or distal strictures including EOE The risks and benefits as well as alternatives of endoscopic procedure(s) have been discussed and reviewed. All questions answered. The patient agrees to proceed.  History of adenomatous colon polyps: Due for surveillance colonoscopy January 2025   The patient was provided an opportunity to ask questions and all were answered. The patient agreed with the plan and demonstrated an understanding of the instructions.  Damaris Hippo , MD    CC: Martinique, Betty G, MD

## 2020-10-30 NOTE — Patient Instructions (Addendum)
You have been scheduled for an MRI cervical spine  at [facility] at [place] on 11/11/2020 . Your appointment time is . Please arrive 15 minutes prior to your appointment time for registration purposes.  In addition, if you have any metal in your body, have a pacemaker or defibrillator, please be sure to let your ordering physician know. This test typically takes 45 minutes to 1 hour to complete. Should you need to reschedule, please call [phone] to do so.  You have been scheduled for a Endoscopy at [facility] on 12/04/2020 at 8:30am, separate instructions have been given  We will refer you to ENT for possible Botox and dilation of upper esophageal sphincter , You will be contacted at a later date with that appointment   Follow up in 4 months  Due to recent changes in healthcare laws, you may see the results of your imaging and laboratory studies on MyChart before your provider has had a chance to review them.  We understand that in some cases there may be results that are confusing or concerning to you. Not all laboratory results come back in the same time frame and the provider may be waiting for multiple results in order to interpret others.  Please give [place] 48 hours in order for your provider to thoroughly review all the results before contacting the office for clarification of your results.   I appreciate the  opportunity to care for you  Thank You   Harl Bowie , MD

## 2020-11-02 ENCOUNTER — Other Ambulatory Visit (HOSPITAL_COMMUNITY): Payer: BC Managed Care – PPO

## 2020-11-08 ENCOUNTER — Encounter: Payer: Self-pay | Admitting: Gastroenterology

## 2020-11-11 ENCOUNTER — Ambulatory Visit (HOSPITAL_COMMUNITY): Payer: BC Managed Care – PPO

## 2020-11-11 ENCOUNTER — Encounter: Payer: Self-pay | Admitting: Gastroenterology

## 2020-11-19 ENCOUNTER — Telehealth: Payer: Self-pay | Admitting: *Deleted

## 2020-11-21 NOTE — Telephone Encounter (Signed)
Dr Silverio Decamp can you look at this with me so I can get this patient rescheduled at [facility]. Barnie Mort was on for 3/9   Can we do her on your hospital week of  4/18

## 2020-11-21 NOTE — Telephone Encounter (Signed)
Dr Silverio Decamp I scheduled her for 12/03/2020 at 1:45pm  Is this ok before I contact the patient to inform her  Thanks

## 2020-11-21 NOTE — Telephone Encounter (Signed)
It's fine. Thank you.

## 2020-11-21 NOTE — Telephone Encounter (Signed)
Jaclyn Spencer, can you please check if you can schedule EGD any other day March 7-11 , I am covering inpatient so I am flexible in regards to procedure time. If we are unable to schedule it in March then we will need to move it to April. Thanks

## 2020-11-27 NOTE — Progress Notes (Signed)
Attempted to obtain medical history via telephone, unable to reach at this time. I left a voicemail to return pre surgical testing department's phone call.  

## 2020-11-29 ENCOUNTER — Telehealth: Payer: Self-pay | Admitting: Gastroenterology

## 2020-11-29 NOTE — Telephone Encounter (Signed)
See alternate phone note for details.

## 2020-11-29 NOTE — Telephone Encounter (Signed)
Patient is having difficulty finding a ride for her procedure at [facility] on 12/03/20.  She wants to reschedule.  We discussed this would delay procedure to very late April.  She will work on getting a ride and call on Monday if she is not able to make it. Procedure will remain as scheduled unless she calls back on Monday.

## 2020-11-29 NOTE — Telephone Encounter (Signed)
Patient called requesting to cancel the hospital procedure scheduled for 12/03/20

## 2020-11-29 NOTE — Telephone Encounter (Signed)
Lm on vm for patient to return call 

## 2020-11-30 ENCOUNTER — Other Ambulatory Visit (HOSPITAL_COMMUNITY)
Admission: RE | Admit: 2020-11-30 | Discharge: 2020-11-30 | Disposition: A | Discharge status: Home / Self Care | Payer: BC Managed Care – PPO | Source: Ambulatory Visit | Attending: Gastroenterology | Admitting: Gastroenterology

## 2020-11-30 DIAGNOSIS — Z01812 Encounter for preprocedural laboratory examination: Secondary | ICD-10-CM | POA: Insufficient documentation

## 2020-11-30 DIAGNOSIS — Z87891 Personal history of nicotine dependence: Secondary | ICD-10-CM | POA: Diagnosis not present

## 2020-11-30 DIAGNOSIS — K2100 Gastro-esophageal reflux disease with esophagitis, without bleeding: Secondary | ICD-10-CM | POA: Diagnosis not present

## 2020-11-30 DIAGNOSIS — K449 Diaphragmatic hernia without obstruction or gangrene: Secondary | ICD-10-CM | POA: Diagnosis not present

## 2020-11-30 DIAGNOSIS — Z20822 Contact with and (suspected) exposure to covid-19: Secondary | ICD-10-CM | POA: Insufficient documentation

## 2020-11-30 DIAGNOSIS — R1310 Dysphagia, unspecified: Secondary | ICD-10-CM | POA: Diagnosis not present

## 2020-11-30 LAB — SARS CORONAVIRUS 2 (TAT 6-24 HRS): SARS Coronavirus 2: NEGATIVE

## 2020-12-02 NOTE — Anesthesia Preprocedure Evaluation (Addendum)
Anesthesia Evaluation  Patient identified by MRN, date of birth, ID band Patient awake    Reviewed: Allergy & Precautions, NPO status , Patient's Chart, lab work & pertinent test results  History of Anesthesia Complications Negative for: history of anesthetic complications  Airway Mallampati: II  TM Distance: >3 FB Neck ROM: Full    Dental  (+) Dental Advisory Given   Pulmonary former smoker,  11/30/2020 SARS coronavirus NEG   breath sounds clear to auscultation       Cardiovascular  Rhythm:Regular Rate:Normal  '18 Exercise stress: normal   Neuro/Psych Anxiety negative neurological ROS     GI/Hepatic Neg liver ROS, GERD  Controlled and Medicated,  Endo/Other  negative endocrine ROS  Renal/GU negative Renal ROS     Musculoskeletal   Abdominal   Peds  Hematology negative hematology ROS (+)   Anesthesia Other Findings   Reproductive/Obstetrics                            Anesthesia Physical Anesthesia Plan  ASA: II  Anesthesia Plan: MAC   Post-op Pain Management:    Induction:   PONV Risk Score and Plan: 2 and Treatment may vary due to age or medical condition and Ondansetron  Airway Management Planned: Natural Airway and Nasal Cannula  Additional Equipment: None  Intra-op Plan:   Post-operative Plan:   Informed Consent: I have reviewed the patients History and Physical, chart, labs and discussed the procedure including the risks, benefits and alternatives for the proposed anesthesia with the patient or authorized representative who has indicated his/her understanding and acceptance.     Dental advisory given  Plan Discussed with: CRNA and Surgeon  Anesthesia Plan Comments:        Anesthesia Quick Evaluation

## 2020-12-03 ENCOUNTER — Other Ambulatory Visit: Payer: Self-pay

## 2020-12-03 ENCOUNTER — Encounter (HOSPITAL_COMMUNITY): Payer: Self-pay | Admitting: Gastroenterology

## 2020-12-03 ENCOUNTER — Ambulatory Visit (HOSPITAL_COMMUNITY): Payer: BC Managed Care – PPO | Admitting: Anesthesiology

## 2020-12-03 ENCOUNTER — Encounter (HOSPITAL_COMMUNITY)
Admission: RE | Disposition: A | Discharge status: Home / Self Care | Payer: Self-pay | Source: Home / Self Care | Attending: Gastroenterology

## 2020-12-03 ENCOUNTER — Ambulatory Visit (HOSPITAL_COMMUNITY)
Admission: RE | Admit: 2020-12-03 | Discharge: 2020-12-03 | Disposition: A | Discharge status: Home / Self Care | Payer: BC Managed Care – PPO | Attending: Gastroenterology | Admitting: Gastroenterology

## 2020-12-03 ENCOUNTER — Telehealth: Payer: Self-pay | Admitting: *Deleted

## 2020-12-03 DIAGNOSIS — K2100 Gastro-esophageal reflux disease with esophagitis, without bleeding: Secondary | ICD-10-CM | POA: Diagnosis not present

## 2020-12-03 DIAGNOSIS — R1319 Other dysphagia: Secondary | ICD-10-CM

## 2020-12-03 DIAGNOSIS — Z20822 Contact with and (suspected) exposure to covid-19: Secondary | ICD-10-CM | POA: Insufficient documentation

## 2020-12-03 DIAGNOSIS — K449 Diaphragmatic hernia without obstruction or gangrene: Secondary | ICD-10-CM | POA: Insufficient documentation

## 2020-12-03 DIAGNOSIS — K209 Esophagitis, unspecified without bleeding: Secondary | ICD-10-CM | POA: Diagnosis not present

## 2020-12-03 DIAGNOSIS — F419 Anxiety disorder, unspecified: Secondary | ICD-10-CM | POA: Diagnosis not present

## 2020-12-03 DIAGNOSIS — R1310 Dysphagia, unspecified: Secondary | ICD-10-CM

## 2020-12-03 DIAGNOSIS — Z87891 Personal history of nicotine dependence: Secondary | ICD-10-CM | POA: Insufficient documentation

## 2020-12-03 DIAGNOSIS — E559 Vitamin D deficiency, unspecified: Secondary | ICD-10-CM | POA: Diagnosis not present

## 2020-12-03 HISTORY — PX: ESOPHAGOGASTRODUODENOSCOPY (EGD) WITH PROPOFOL: SHX5813

## 2020-12-03 SURGERY — ESOPHAGOGASTRODUODENOSCOPY (EGD) WITH PROPOFOL
Anesthesia: Monitor Anesthesia Care

## 2020-12-03 MED ORDER — PROPOFOL 500 MG/50ML IV EMUL
INTRAVENOUS | Status: DC | PRN
  Administered 2020-12-03: 135 ug/kg/min via INTRAVENOUS

## 2020-12-03 MED ORDER — PANTOPRAZOLE SODIUM 40 MG PO TBEC: 40.0000 mg | DELAYED_RELEASE_TABLET | Freq: Every day | ORAL | 11 refills | Status: DC

## 2020-12-03 MED ORDER — LACTATED RINGERS IV SOLN
INTRAVENOUS | Status: DC
  Administered 2020-12-03: 1000 mL via INTRAVENOUS

## 2020-12-03 MED ORDER — PROPOFOL 1000 MG/100ML IV EMUL
INTRAVENOUS | Status: AC
  Filled 2020-12-03: qty 100

## 2020-12-03 MED ORDER — SODIUM CHLORIDE 0.9 % IV SOLN: INTRAVENOUS | Status: DC

## 2020-12-03 MED ORDER — PROPOFOL 10 MG/ML IV BOLUS
INTRAVENOUS | Status: DC | PRN
  Administered 2020-12-03: 30 mg via INTRAVENOUS
  Administered 2020-12-03: 20 mg via INTRAVENOUS

## 2020-12-03 MED ORDER — PROPOFOL 10 MG/ML IV BOLUS
INTRAVENOUS | Status: AC
  Filled 2020-12-03: qty 20

## 2020-12-03 SURGICAL SUPPLY — 15 items

## 2020-12-03 NOTE — Anesthesia Procedure Notes (Signed)
Procedure Name: MAC Date/Time: 12/03/2020 7:36 AM Performed by: Niel Hummer, CRNA Pre-anesthesia Checklist: Patient identified, Emergency Drugs available, Suction available and Patient being monitored Oxygen Delivery Method: Simple face mask

## 2020-12-03 NOTE — Telephone Encounter (Signed)
Submitted prior auth for protonix and it was approved for pt. Faxed approval to [employer]

## 2020-12-03 NOTE — H&P (Signed)
[facility] History and Physical   Primary Care Physician:  Martinique, Betty G, MD   Reason for Procedure:   Dysphagia  Plan:    EGD     HPI: Jaclyn Spencer is a 65 y.o. female with dysphagia here for EGD for further evaluation. Findings of Barium suggestive of Cricopharyngeal achalasia, will need to exclude pseudoachalasia, esophageal ring or eosinophilic esophagitis.    Past Medical History:  Diagnosis Date  . Abdominal pain   . Allergy    rhinitis  . Anxiety   . B12 deficiency   . Basal cell carcinoma   . Diverticulosis   . GERD (gastroesophageal reflux disease)   . Vitamin D deficiency   . Weight loss     Past Surgical History:  Procedure Laterality Date  . MOHS SURGERY     nose    Prior to Admission medications   Medication Sig Start Date End Date Taking? Authorizing Provider  azelastine (ASTELIN) 0.1 % nasal spray Place 1 spray into both nostrils daily as needed for allergies or rhinitis. 11/20/16  Yes [provider]  dicyclomine (BENTYL) 10 MG capsule Take 1 capsule (10 mg total) by mouth every 8 (eight) hours as needed for spasms. 08/14/20  Yes Maleiah Dula, Venia Minks, MD  famotidine (PEPCID) 40 MG tablet Take 1 tablet (40 mg total) by mouth at bedtime. Patient taking differently: Take 40 mg by mouth daily as needed for heartburn. 08/14/20  Yes Shantina Chronister, Venia Minks, MD  hydrocortisone (ANUSOL-HC) 2.5 % rectal cream Place 1 application rectally at bedtime as needed for hemorrhoids or anal itching. 08/14/20  Yes Haruo Stepanek, Venia Minks, MD  loratadine (CLARITIN) 10 MG tablet Take 10 mg by mouth daily as needed for allergies.   Yes [provider]  Multiple Vitamins-Minerals (MULTIVITAMIN WITH MINERALS) tablet Take 1 tablet by mouth 3 (three) times a week.   Yes [provider]    Current Facility-Administered Medications  Medication Dose Route Frequency Provider Last Rate Last Admin  . 0.9 %  sodium chloride infusion   Intravenous  Continuous Ripken Rekowski V, MD      . lactated ringers infusion   Intravenous Continuous Luella Gardenhire V, MD 10 mL/hr at 12/03/20 0725 1,000 mL at 12/03/20 0725    Allergies as of 10/30/2020  . (No Known Allergies)    Family History  Problem Relation Age of Onset  . Colitis Mother   . Inflammatory bowel disease Mother   . Coronary artery disease Mother 74       Stents  . Heart attack Mother   . Kidney cancer Mother   . Lung cancer Father   . Coronary artery disease Paternal Grandfather   . Stomach cancer Paternal Grandmother   . Stomach cancer Paternal Aunt   . Arthritis Sister   . Cancer Brother        melanoma  . Diabetes Neg Hx   . Hyperlipidemia Neg Hx   . Hypertension Neg Hx   . Sudden death Neg Hx   . Esophageal cancer Neg Hx   . Pancreatic cancer Neg Hx   . Liver disease Neg Hx   . Colon cancer Neg Hx     Social History   Socioeconomic History  . Marital status: Married    Spouse name: Not on file  . Number of children: 0  . Years of education: Not on file  . Highest education level: Not on file  Occupational History  . Not on file  Tobacco Use  .  Smoking status: Former Smoker    Types: Cigarettes    Quit date: 09/28/1976    Years since quitting: 44.2  . Smokeless tobacco: Never Used  Vaping Use  . Vaping Use: Never used  Substance and Sexual Activity  . Alcohol use: Yes    Comment: occ  . Drug use: No  . Sexual activity: Not Currently  Other Topics Concern  . Not on file  Social History Narrative  . Not on file   Social Determinants of Health   Financial Resource Strain: Not on file  Food Insecurity: Not on file  Transportation Needs: Not on file  Physical Activity: Not on file  Stress: Not on file  Social Connections: Not on file  Intimate Partner Violence: Not on file    Review of Systems:  All other review of systems negative except as mentioned in the HPI.  Physical Exam: Vital signs in last 24 hours: Temp:  [97.9 F  (36.6 C)] 97.9 F (36.6 C) (03/08 0713) Pulse Rate:  [88] 88 (03/08 0713) Resp:  [15] 15 (03/08 0713) BP: (143)/(97) 143/97 (03/08 0713) SpO2:  [98 %] 98 % (03/08 0713) Weight:  [56.7 kg] 56.7 kg (03/08 0713)   General:   Alert, NAD Lungs:  Clear .   Heart:  Regular rate and rhythm Abdomen:  Soft, nontender and nondistended. Neuro/Psych:  Alert and cooperative. Normal mood and affect. A and O x 3   K. Denzil Magnuson , MD [phone]

## 2020-12-03 NOTE — Discharge Instructions (Signed)
YOU HAD AN ENDOSCOPIC PROCEDURE TODAY: Refer to the procedure report and other information in the discharge instructions given to you for any specific questions about what was found during the examination. If this information does not answer your questions, please call [place] office at [phone] to clarify.   YOU SHOULD EXPECT: Some feelings of bloating in the abdomen. Passage of more gas than usual. Walking can help get rid of the air that was put into your GI tract during the procedure and reduce the bloating. If you had a lower endoscopy (such as a colonoscopy or flexible sigmoidoscopy) you may notice spotting of blood in your stool or on the toilet paper. Some abdominal soreness may be present for a day or two, also.  DIET: Your first meal following the procedure should be a light meal and then it is ok to progress to your normal diet. A half-sandwich or bowl of soup is an example of a good first meal. Heavy or fried foods are harder to digest and may make you feel nauseous or bloated. Drink plenty of fluids but you should avoid alcoholic beverages for 24 hours. If you had a esophageal dilation, please see attached instructions for diet.    ACTIVITY: Your care partner should take you home directly after the procedure. You should plan to take it easy, moving slowly for the rest of the day. You can resume normal activity the day after the procedure however YOU SHOULD NOT DRIVE, use power tools, machinery or perform tasks that involve climbing or major physical exertion for 24 hours (because of the sedation medicines used during the test).   SYMPTOMS TO REPORT IMMEDIATELY: A gastroenterologist can be reached at any hour. Please call [phone]  for any of the following symptoms:   Following upper endoscopy (EGD, EUS, ERCP, esophageal dilation) Vomiting of blood or coffee ground material  New, significant abdominal pain  New, significant chest pain or pain under the shoulder blades  Painful or  persistently difficult swallowing  New shortness of breath  Black, tarry-looking or red, bloody stools  FOLLOW UP:  If any biopsies were taken you will be contacted by phone or by letter within the next 1-3 weeks. Call [phone]  if you have not heard about the biopsies in 3 weeks.  Please also call with any specific questions about appointments or follow up tests.  

## 2020-12-03 NOTE — Op Note (Signed)
[facility] Patient Name: Jaclyn Spencer Procedure Date: 12/03/2020 MRN: [id number] Attending MD: Mauri Pole , MD Date of Birth: [birth year] CSN: [id number] Age: 65 Admit Type: Outpatient Procedure:                Upper GI endoscopy Indications:              Dysphagia Providers:                Mauri Pole, MD, Cleda Daub, RN, Benetta Spar, Technician Referring MD:              Medicines:                Monitored Anesthesia Care Complications:            No immediate complications. Estimated Blood Loss:     Estimated blood loss was minimal. Procedure:                Pre-Anesthesia Assessment:                           - Prior to the procedure, a History and Physical                            was performed, and patient medications and                            allergies were reviewed. The patient's tolerance of                            previous anesthesia was also reviewed. The risks                            and benefits of the procedure and the sedation                            options and risks were discussed with the patient.                            All questions were answered, and informed consent                            was obtained. Prior Anticoagulants: The patient has                            taken no previous anticoagulant or antiplatelet                            agents. ASA Grade Assessment: II - A patient with                            mild systemic disease. After reviewing the risks  and benefits, the patient was deemed in                            satisfactory condition to undergo the procedure.                           After obtaining informed consent, the endoscope was                            passed under direct vision. Throughout the                            procedure, the patient's blood pressure, pulse, and                            oxygen saturations were  monitored continuously. The                            GIF-H190 ([id number]) Olympus gastroscope was                            introduced through the mouth, and advanced to the                            second part of duodenum. The upper GI endoscopy was                            accomplished without difficulty. The patient                            tolerated the procedure well. Scope In: Scope Out: Findings:      A 2 cm hiatal hernia was present.      The gastroesophageal flap valve was visualized endoscopically and       classified as Hill Grade III (minimal fold, loose to endoscope, hiatal       hernia likely).      LA Grade B (one or more mucosal breaks greater than 5 mm, not extending       between the tops of two mucosal folds) esophagitis with no bleeding was       found. Biopsies were obtained from the proximal and distal esophagus       with cold forceps for histology of suspected eosinophilic esophagitis.      The stomach was normal.      The examined duodenum was normal. Impression:               - 2 cm hiatal hernia.                           - Gastroesophageal flap valve classified as Hill                            Grade III (minimal fold, loose to endoscope, hiatal                            hernia likely).                           -  LA Grade B esophagitis with no bleeding. Biopsied.                           - Normal stomach.                           - Normal examined duodenum. Moderate Sedation:      Not Applicable - Patient had care per Anesthesia. Recommendation:           - Patient has a contact number available for                            emergencies. The signs and symptoms of potential                            delayed complications were discussed with the                            patient. Return to normal activities tomorrow.                            Written discharge instructions were provided to the                            patient.                            - Resume previous diet.                           - Continue present medications.                           - Await pathology results.                           - Follow an antireflux regimen.                           - Use Protonix (pantoprazole) 40 mg PO daily.                           - Use Pepcid (famotidine) 40 mg PO daily at bedtime                            as needed                           - Return to GI office in 2 months. Please call to                            schedule appointment. Procedure Code(s):        --- Professional ---                           [phone], Esophagogastroduodenoscopy, flexible,  transoral; with biopsy, single or multiple Diagnosis Code(s):        --- Professional ---                           K44.9, Diaphragmatic hernia without obstruction or                            gangrene                           K20.90, Esophagitis, unspecified without bleeding                           R13.10, Dysphagia, unspecified CPT copyright 2019 American Medical Association. All rights reserved. The codes documented in this report are preliminary and upon coder review may  be revised to meet current compliance requirements. Mauri Pole, MD 12/03/2020 8:08:49 AM This report has been signed electronically. Number of Addenda: 0

## 2020-12-03 NOTE — Anesthesia Postprocedure Evaluation (Signed)
Anesthesia Post Note  Patient: [name]  Procedure(s) Performed: ESOPHAGOGASTRODUODENOSCOPY (EGD) WITH PROPOFOL (N/A )     Patient location during evaluation: Endoscopy Anesthesia Type: MAC Level of consciousness: awake and alert, patient cooperative and oriented Pain management: pain level controlled Vital Signs Assessment: post-procedure vital signs reviewed and stable Respiratory status: spontaneous breathing, nonlabored ventilation and respiratory function stable Cardiovascular status: blood pressure returned to baseline and stable Postop Assessment: no apparent nausea or vomiting and able to ambulate Anesthetic complications: no   No complications documented.  Last Vitals:  Vitals:   12/03/20 0802 12/03/20 0803  BP: 115/74   Pulse: 78   Resp: 18   Temp:  36.4 C  SpO2: 100%     Last Pain:  Vitals:   12/03/20 0803  TempSrc: Oral  PainSc:                  Zenith Kercheval,E. Ashanna Heinsohn

## 2020-12-03 NOTE — Transfer of Care (Signed)
Immediate Anesthesia Transfer of Care Note  Patient: Jaclyn Spencer  Procedure(s) Performed: ESOPHAGOGASTRODUODENOSCOPY (EGD) WITH PROPOFOL (N/A )  Patient Location: PACU  Anesthesia Type:MAC  Level of Consciousness: awake, alert  and oriented  Airway & Oxygen Therapy: Patient Spontanous Breathing and Patient connected to face mask oxygen  Post-op Assessment: Report given to RN, Post -op Vital signs reviewed and stable and Patient moving all extremities X 4  Post vital signs: Reviewed and stable  Last Vitals:  Vitals Value Taken Time  BP    Temp    Pulse    Resp    SpO2      Last Pain:  Vitals:   12/03/20 0713  TempSrc: Oral  PainSc: 0-No pain         Complications: No complications documented.

## 2020-12-04 ENCOUNTER — Encounter (HOSPITAL_COMMUNITY): Payer: Self-pay | Admitting: Gastroenterology

## 2020-12-04 LAB — SURGICAL PATHOLOGY

## 2020-12-09 ENCOUNTER — Other Ambulatory Visit: Payer: Self-pay | Admitting: *Deleted

## 2020-12-09 DIAGNOSIS — R1319 Other dysphagia: Secondary | ICD-10-CM

## 2020-12-17 ENCOUNTER — Encounter: Payer: Self-pay | Admitting: Gastroenterology

## 2020-12-30 ENCOUNTER — Other Ambulatory Visit: Payer: Self-pay

## 2020-12-30 ENCOUNTER — Ambulatory Visit (INDEPENDENT_AMBULATORY_CARE_PROVIDER_SITE_OTHER): Payer: BC Managed Care – PPO | Admitting: Otolaryngology

## 2020-12-30 VITALS — Temp 97.3°F

## 2020-12-30 DIAGNOSIS — R1319 Other dysphagia: Secondary | ICD-10-CM

## 2020-12-30 DIAGNOSIS — K224 Dyskinesia of esophagus: Secondary | ICD-10-CM

## 2020-12-30 NOTE — Progress Notes (Signed)
HPI: Jaclyn Spencer is a 65 y.o. female who presents is referred by [name] for evaluation of prominent cricopharyngeus muscle.  Patient had been experienced some dysphagia with large pills getting cough in the upper throat region.  She subsequently underwent upper endoscopy and they had difficulty initially passing the scope through the upper esophageal sphincter.  She had a second upper endoscopy in the hospital and was able to have the scope passed with normal upper cervical esophagus.  She subsequently had a barium swallow that demonstrated a prominent cricopharyngeus muscle.  I reviewed this in the office today with the patient showing the prominent cricopharyngeus muscle.  She is having no sore throat and no hoarseness.. She notices most of her difficulty swallowing when she tries to swallow large pills.  Past Medical History:  Diagnosis Date  . Abdominal pain   . Allergy    rhinitis  . Anxiety   . B12 deficiency   . Basal cell carcinoma   . Diverticulosis   . GERD (gastroesophageal reflux disease)   . Vitamin D deficiency   . Weight loss    Past Surgical History:  Procedure Laterality Date  . ESOPHAGOGASTRODUODENOSCOPY (EGD) WITH PROPOFOL N/A 12/03/2020   Procedure: ESOPHAGOGASTRODUODENOSCOPY (EGD) WITH PROPOFOL;  Surgeon: Mauri Pole, MD;  Location: [facility];  Service: Endoscopy;  Laterality: N/A;  . MOHS SURGERY     nose   Social History   Socioeconomic History  . Marital status: Married    Spouse name: Not on file  . Number of children: 0  . Years of education: Not on file  . Highest education level: Not on file  Occupational History  . Not on file  Tobacco Use  . Smoking status: Former Smoker    Types: Cigarettes    Quit date: 09/28/1976    Years since quitting: 44.2  . Smokeless tobacco: Never Used  Vaping Use  . Vaping Use: Never used  Substance and Sexual Activity  . Alcohol use: Yes    Comment: occ  . Drug use: No  . Sexual activity: Not  Currently  Other Topics Concern  . Not on file  Social History Narrative  . Not on file   Social Determinants of Health   Financial Resource Strain: Not on file  Food Insecurity: Not on file  Transportation Needs: Not on file  Physical Activity: Not on file  Stress: Not on file  Social Connections: Not on file   Family History  Problem Relation Age of Onset  . Colitis Mother   . Inflammatory bowel disease Mother   . Coronary artery disease Mother 10       Stents  . Heart attack Mother   . Kidney cancer Mother   . Lung cancer Father   . Coronary artery disease Paternal Grandfather   . Stomach cancer Paternal Grandmother   . Stomach cancer Paternal Aunt   . Arthritis Sister   . Cancer Brother        melanoma  . Diabetes Neg Hx   . Hyperlipidemia Neg Hx   . Hypertension Neg Hx   . Sudden death Neg Hx   . Esophageal cancer Neg Hx   . Pancreatic cancer Neg Hx   . Liver disease Neg Hx   . Colon cancer Neg Hx    No Known Allergies Prior to Admission medications   Medication Sig Start Date End Date Taking? Authorizing Provider  azelastine (ASTELIN) 0.1 % nasal spray Place 1 spray into both nostrils daily as  needed for allergies or rhinitis. 11/20/16   [provider]  dicyclomine (BENTYL) 10 MG capsule Take 1 capsule (10 mg total) by mouth every 8 (eight) hours as needed for spasms. 08/14/20   Mauri Pole, MD  famotidine (PEPCID) 40 MG tablet Take 1 tablet (40 mg total) by mouth at bedtime. Patient taking differently: Take 40 mg by mouth daily as needed for heartburn. 08/14/20   Mauri Pole, MD  hydrocortisone (ANUSOL-HC) 2.5 % rectal cream Place 1 application rectally at bedtime as needed for hemorrhoids or anal itching. 08/14/20   Mauri Pole, MD  loratadine (CLARITIN) 10 MG tablet Take 10 mg by mouth daily as needed for allergies.    [provider]  Multiple Vitamins-Minerals (MULTIVITAMIN WITH MINERALS) tablet Take 1 tablet by  mouth 3 (three) times a week.    [provider]  pantoprazole (PROTONIX) 40 MG tablet Take 1 tablet (40 mg total) by mouth daily. 12/03/20 12/03/21  Mauri Pole, MD     Positive ROS: She complained of some tightness in her shoulders and in her neck and wondered if this was related to the prominent cricopharyngeus.  I discussed with her that this is unrelated.  All other systems have been reviewed and were otherwise negative with the exception of those mentioned in the HPI and as above.  Physical Exam: Constitutional: Alert, well-appearing, no acute distress Ears: External ears without lesions or tenderness. Ear canals are clear bilaterally with intact, clear TMs.  Nasal: External nose without lesions. Septum midline.. Clear nasal passages Oral: Lips and gums without lesions. Tongue and palate mucosa without lesions. Posterior oropharynx clear.  Dentition in good repair. Neck: No palpable adenopathy or masses.  Patient has no significant palpable adenopathy or masses in the neck.  She has good extension of her neck posteriorly. Respiratory: Breathing comfortably  Skin: No facial/neck lesions or rash noted.  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  Plan: Briefly discussed the options of a Botox injection into the cricopharyngeus muscle and dilation of the cricopharyngeus muscle.  Discussed with her concerning this requiring general anesthesia.  I discussed the pros and cons of the procedure with her.  She will call us back if she elects to have this performed.   Radene Journey, MD   CC:

## 2021-02-17 ENCOUNTER — Encounter: Payer: Self-pay | Admitting: Gastroenterology

## 2021-02-17 ENCOUNTER — Ambulatory Visit: Payer: BC Managed Care – PPO | Admitting: Gastroenterology

## 2021-02-17 VITALS — BP 110/80 | HR 88 | Ht 66.0 in | Wt 126.0 lb

## 2021-02-17 DIAGNOSIS — K449 Diaphragmatic hernia without obstruction or gangrene: Secondary | ICD-10-CM

## 2021-02-17 DIAGNOSIS — K219 Gastro-esophageal reflux disease without esophagitis: Secondary | ICD-10-CM | POA: Diagnosis not present

## 2021-02-17 DIAGNOSIS — R1314 Dysphagia, pharyngoesophageal phase: Secondary | ICD-10-CM | POA: Diagnosis not present

## 2021-02-17 DIAGNOSIS — K220 Achalasia of cardia: Secondary | ICD-10-CM

## 2021-02-17 NOTE — Progress Notes (Signed)
Jaclyn Spencer    [id number]    [birth year]  Primary Care Physician:Jordan, Malka So, MD  Referring Physician: Martinique, Betty G, MD [place],  [place] [address]   Chief complaint:  Dysphagia  HPI:  65 year old very pleasant female here for follow-up visit for dysphagia She continues to have difficulty swallowing large pills and tough meat like steak.  She is fine with most soft foods and liquids. She has seen ENT for cricopharyngeal achalasia, wants to hold off doing surgery or Botox injection due to potential side effects and complications and her symptoms are currently manageable  EGD December 03, 2020 - 2 cm hiatal hernia. - Gastroesophageal flap valve classified as Hill Grade III (minimal fold, loose to endoscope, hiatal hernia likely). - LA Grade B esophagitis with no bleeding. Biopsied. - Normal stomach. - Normal  Barium esophagram October 21, 2020: Prominent cricopharyngeal impression on posterior cervical esophagus during swallowing, 13 mm barium tablet became stuck at this level, passed after multiple large swallows of water.  No Zenker's diverticulum Cricopharyngeal achalasia  EGD 10/10/20: Upper esophageal sphincter spasm, unable to intubate esophagus  Colonoscopy 10/10/20: 14 mm polyp removed, 47mm polyp , diverticulosis and hemorrhoids.   Denies any neck trauma.  She does not take any medications or herbal supplements. Denies any nausea, vomiting, abdominal pain, melena or bright red blood per rectum   Outpatient Encounter Medications as of 02/17/2021  Medication Sig  . azelastine (ASTELIN) 0.1 % nasal spray Place 1 spray into both nostrils daily as needed for allergies or rhinitis.  Marland Kitchen dicyclomine (BENTYL) 10 MG capsule Take 1 capsule (10 mg total) by mouth every 8 (eight) hours as needed for spasms.  . hydrocortisone (ANUSOL-HC) 2.5 % rectal cream Place 1 application rectally at bedtime as needed for hemorrhoids or anal itching.  .  loratadine (CLARITIN) 10 MG tablet Take 10 mg by mouth daily as needed for allergies.  . Multiple Vitamins-Minerals (MULTIVITAMIN WITH MINERALS) tablet Take 1 tablet by mouth 3 (three) times a week.  . pantoprazole (PROTONIX) 40 MG tablet Take 1 tablet (40 mg total) by mouth daily.  . [DISCONTINUED] famotidine (PEPCID) 40 MG tablet Take 1 tablet (40 mg total) by mouth at bedtime. (Patient taking differently: Take 40 mg by mouth daily as needed for heartburn.)   No facility-administered encounter medications on file as of 02/17/2021.    Allergies as of 02/17/2021  . (No Known Allergies)    Past Medical History:  Diagnosis Date  . Abdominal pain   . Allergy    rhinitis  . Anxiety   . B12 deficiency   . Basal cell carcinoma   . Diverticulosis   . GERD (gastroesophageal reflux disease)   . Vitamin D deficiency   . Weight loss     Past Surgical History:  Procedure Laterality Date  . ESOPHAGOGASTRODUODENOSCOPY (EGD) WITH PROPOFOL N/A 12/03/2020   Procedure: ESOPHAGOGASTRODUODENOSCOPY (EGD) WITH PROPOFOL;  Surgeon: Mauri Pole, MD;  Location: [facility];  Service: Endoscopy;  Laterality: N/A;  . MOHS SURGERY     nose    Family History  Problem Relation Age of Onset  . Colitis Mother   . Inflammatory bowel disease Mother   . Coronary artery disease Mother 30       Stents  . Heart attack Mother   . Kidney cancer Mother   . Lung cancer Father   . Coronary artery disease Paternal Grandfather   . Stomach cancer  Paternal Grandmother   . Stomach cancer Paternal Aunt   . Arthritis Sister   . Cancer Brother        melanoma  . Diabetes Neg Hx   . Hyperlipidemia Neg Hx   . Hypertension Neg Hx   . Sudden death Neg Hx   . Esophageal cancer Neg Hx   . Pancreatic cancer Neg Hx   . Liver disease Neg Hx   . Colon cancer Neg Hx     Social History   Socioeconomic History  . Marital status: Married    Spouse name: Not on file  . Number of children: 0  . Years of  education: Not on file  . Highest education level: Not on file  Occupational History  . Not on file  Tobacco Use  . Smoking status: Former Smoker    Types: Cigarettes    Quit date: 09/28/1976    Years since quitting: 44.4  . Smokeless tobacco: Never Used  Vaping Use  . Vaping Use: Never used  Substance and Sexual Activity  . Alcohol use: Yes    Comment: occ  . Drug use: No  . Sexual activity: Not Currently  Other Topics Concern  . Not on file  Social History Narrative  . Not on file   Social Determinants of Health   Financial Resource Strain: Not on file  Food Insecurity: Not on file  Transportation Needs: Not on file  Physical Activity: Not on file  Stress: Not on file  Social Connections: Not on file  Intimate Partner Violence: Not on file      Review of systems: All other review of systems negative except as mentioned in the HPI.   Physical Exam: Vitals:   02/17/21 0811  BP: 110/80  Pulse: 88   Body mass index is 20.34 kg/m. Gen:      No acute distress HEENT:  sclera anicteric Abd:      soft, non-tender; no palpable masses, no distension Ext:    No edema Neuro: alert and oriented x 3 Psych: normal mood and affect  Data Reviewed:  Reviewed labs, radiology imaging, old records and pertinent past GI work up   Assessment and Plan/Recommendations:  65 year old very pleasant female with dysphagia secondary to cricopharyngeal achalasia and GERD with erosive esophagitis  GERD with erosive esophagitis and hiatal hernia: Continue with lifestyle modifications and antireflux measures We will decrease pantoprazole to 20 mg daily before breakfast She mostly has nocturnal reflux, add Pepcid at bedtime as needed Use Gaviscon after meals for breakthrough heartburn or reflux symptoms  Cricopharyngeal achalasia: Symptoms are manageable we will continue to monitor We will plan for MRI neck at some point to exclude cervical osteophyte compressing on the  esophagus  Return in 1 year or sooner if needed  The patient was provided an opportunity to ask questions and all were answered. The patient agreed with the plan and demonstrated an understanding of the instructions.  Damaris Hippo , MD    CC: Martinique, Betty G, MD

## 2021-02-17 NOTE — Patient Instructions (Signed)
Decrease Pantoprazole 20 mg to  Daily  Use Pepcid 20 mg daily as needed  Take Gaviscon 1 tablet chewable after meals three times a day as needed   [employer]'s Current Therapy 2021 (pp. 213-216). [place], [place]: Elsevier.">  Gastroesophageal Reflux Disease, Adult Gastroesophageal reflux (GER) happens when acid from the stomach flows up into the tube that connects the mouth and the stomach (esophagus). Normally, food travels down the esophagus and stays in the stomach to be digested. However, when a person has GER, food and stomach acid sometimes move back up into the esophagus. If this becomes a more serious problem, the person may be diagnosed with a disease called gastroesophageal reflux disease (GERD). GERD occurs when the reflux:  Happens often.  Causes frequent or severe symptoms.  Causes problems such as damage to the esophagus. When stomach acid comes in contact with the esophagus, the acid may cause inflammation in the esophagus. Over time, GERD may create small holes (ulcers) in the lining of the esophagus. What are the causes? This condition is caused by a problem with the muscle between the esophagus and the stomach (lower esophageal sphincter, or LES). Normally, the LES muscle closes after food passes through the esophagus to the stomach. When the LES is weakened or abnormal, it does not close properly, and that allows food and stomach acid to go back up into the esophagus. The LES can be weakened by certain dietary substances, medicines, and medical conditions, including:  Tobacco use.  Pregnancy.  Having a hiatal hernia.  Alcohol use.  Certain foods and beverages, such as coffee, chocolate, onions, and peppermint. What increases the risk? You are more likely to develop this condition if you:  Have an increased body weight.  Have a connective tissue disorder.  Take NSAIDs, such as ibuprofen. What are the signs or symptoms? Symptoms of this condition  include:  Heartburn.  Difficult or painful swallowing and the feeling of having a lump in the throat.  A bitter taste in the mouth.  Bad breath and having a large amount of saliva.  Having an upset or bloated stomach and belching.  Chest pain. Different conditions can cause chest pain. Make sure you see your health care provider if you experience chest pain.  Shortness of breath or wheezing.  Ongoing (chronic) cough or a nighttime cough.  Wearing away of tooth enamel.  Weight loss. How is this diagnosed? This condition may be diagnosed based on a medical history and a physical exam. To determine if you have mild or severe GERD, your health care provider may also monitor how you respond to treatment. You may also have tests, including:  A test to examine your stomach and esophagus with a small camera (endoscopy).  A test that measures the acidity level in your esophagus.  A test that measures how much pressure is on your esophagus.  A barium swallow or modified barium swallow test to show the shape, size, and functioning of your esophagus. How is this treated? Treatment for this condition may vary depending on how severe your symptoms are. Your health care provider may recommend:  Changes to your diet.  Medicine.  Surgery. The goal of treatment is to help relieve your symptoms and to prevent complications. Follow these instructions at home: Eating and drinking  Follow a diet as recommended by your health care provider. This may involve avoiding foods and drinks such as: ? Coffee and tea, with or without caffeine. ? Drinks that contain alcohol. ? Energy drinks and  sports drinks. ? Carbonated drinks or sodas. ? Chocolate and cocoa. ? Peppermint and mint flavorings. ? Garlic and onions. ? Horseradish. ? Spicy and acidic foods, including peppers, chili powder, curry powder, vinegar, hot sauces, and barbecue sauce. ? Citrus fruit juices and citrus fruits, such as  oranges, lemons, and limes. ? Tomato-based foods, such as red sauce, chili, salsa, and pizza with red sauce. ? Fried and fatty foods, such as donuts, french fries, potato chips, and high-fat dressings. ? High-fat meats, such as hot dogs and fatty cuts of red and white meats, such as rib eye steak, sausage, ham, and bacon. ? High-fat dairy items, such as whole milk, butter, and cream cheese.  Eat small, frequent meals instead of large meals.  Avoid drinking large amounts of liquid with your meals.  Avoid eating meals during the 2-3 hours before bedtime.  Avoid lying down right after you eat.  Do not exercise right after you eat.   Lifestyle  Do not use any products that contain nicotine or tobacco. These products include cigarettes, chewing tobacco, and vaping devices, such as e-cigarettes. If you need help quitting, ask your health care provider.  Try to reduce your stress by using methods such as yoga or meditation. If you need help reducing stress, ask your health care provider.  If you are overweight, reduce your weight to an amount that is healthy for you. Ask your health care provider for guidance about a safe weight loss goal.   General instructions  Pay attention to any changes in your symptoms.  Take over-the-counter and prescription medicines only as told by your health care provider. Do not take aspirin, ibuprofen, or other NSAIDs unless your health care provider told you to take these medicines.  Wear loose-fitting clothing. Do not wear anything tight around your waist that causes pressure on your abdomen.  Raise (elevate) the head of your bed about 6 inches (15 cm). You can use a wedge to do this.  Avoid bending over if this makes your symptoms worse.  Keep all follow-up visits. This is important. Contact a health care provider if:  You have: ? New symptoms. ? Unexplained weight loss. ? Difficulty swallowing or it hurts to swallow. ? Wheezing or a persistent  cough. ? A hoarse voice.  Your symptoms do not improve with treatment. Get help right away if:  You have sudden pain in your arms, neck, jaw, teeth, or back.  You suddenly feel sweaty, dizzy, or light-headed.  You have chest pain or shortness of breath.  You vomit and the vomit is green, yellow, or black, or it looks like blood or coffee grounds.  You faint.  You have stool that is red, bloody, or black.  You cannot swallow, drink, or eat. These symptoms may represent a serious problem that is an emergency. Do not wait to see if the symptoms will go away. Get medical help right away. Call your local emergency services (911 in the [place] Do not drive yourself to the hospital. Summary  Gastroesophageal reflux happens when acid from the stomach flows up into the esophagus. GERD is a disease in which the reflux happens often, causes frequent or severe symptoms, or causes problems such as damage to the esophagus.  Treatment for this condition may vary depending on how severe your symptoms are. Your health care provider may recommend diet and lifestyle changes, medicine, or surgery.  Contact a health care provider if you have new or worsening symptoms.  Take over-the-counter and prescription  medicines only as told by your health care provider. Do not take aspirin, ibuprofen, or other NSAIDs unless your health care provider told you to do so.  Keep all follow-up visits as told by your health care provider. This is important. This information is not intended to replace advice given to you by your health care provider. Make sure you discuss any questions you have with your health care provider. Document Revised: 03/25/2020 Document Reviewed: 03/25/2020 Elsevier Patient Education  2021 [employer].   Due to recent changes in healthcare laws, you may see the results of your imaging and laboratory studies on MyChart before your provider has had a chance to review them.  We understand that  in some cases there may be results that are confusing or concerning to you. Not all laboratory results come back in the same time frame and the provider may be waiting for multiple results in order to interpret others.  Please give [place] 48 hours in order for your provider to thoroughly review all the results before contacting the office for clarification of your results.   Thank you for choosing [facility]  Karleen Hampshire Nandigam,MD

## 2021-02-19 ENCOUNTER — Telehealth: Payer: Self-pay | Admitting: Gastroenterology

## 2021-02-19 MED ORDER — PANTOPRAZOLE SODIUM 20 MG PO TBEC: 20.0000 mg | DELAYED_RELEASE_TABLET | Freq: Every day | ORAL | 3 refills | Status: DC

## 2021-02-19 NOTE — Telephone Encounter (Signed)
Protonix 20 mg sent to [facility] today

## 2021-02-19 NOTE — Telephone Encounter (Signed)
Inbound call from patient. States she went to the pharmacy to pick up prescription but they did not have it. For Protonix 20mg . [facility]

## 2021-04-07 ENCOUNTER — Ambulatory Visit: Payer: BC Managed Care – PPO

## 2021-04-14 ENCOUNTER — Ambulatory Visit: Payer: BC Managed Care – PPO | Admitting: Family Medicine

## 2021-04-16 ENCOUNTER — Other Ambulatory Visit: Payer: Self-pay

## 2021-04-16 ENCOUNTER — Ambulatory Visit (INDEPENDENT_AMBULATORY_CARE_PROVIDER_SITE_OTHER): Payer: BC Managed Care – PPO

## 2021-04-16 DIAGNOSIS — Z23 Encounter for immunization: Secondary | ICD-10-CM | POA: Diagnosis not present

## 2021-04-16 NOTE — Progress Notes (Signed)
Per orders of Dr. Martinique, injection of Tdap given by Rodrigo Ran. Patient tolerated injection well.

## 2021-07-08 ENCOUNTER — Other Ambulatory Visit: Payer: Self-pay

## 2021-07-08 ENCOUNTER — Encounter (HOSPITAL_BASED_OUTPATIENT_CLINIC_OR_DEPARTMENT_OTHER): Payer: Self-pay | Admitting: Nurse Practitioner

## 2021-07-08 ENCOUNTER — Ambulatory Visit (INDEPENDENT_AMBULATORY_CARE_PROVIDER_SITE_OTHER): Payer: BC Managed Care – PPO | Admitting: Nurse Practitioner

## 2021-07-08 VITALS — BP 124/78 | HR 81 | Ht 66.0 in | Wt 125.8 lb

## 2021-07-08 DIAGNOSIS — Z23 Encounter for immunization: Secondary | ICD-10-CM | POA: Insufficient documentation

## 2021-07-08 DIAGNOSIS — M6281 Muscle weakness (generalized): Secondary | ICD-10-CM

## 2021-07-08 DIAGNOSIS — Z1231 Encounter for screening mammogram for malignant neoplasm of breast: Secondary | ICD-10-CM

## 2021-07-08 DIAGNOSIS — K2100 Gastro-esophageal reflux disease with esophagitis, without bleeding: Secondary | ICD-10-CM

## 2021-07-08 DIAGNOSIS — Z0000 Encounter for general adult medical examination without abnormal findings: Secondary | ICD-10-CM | POA: Insufficient documentation

## 2021-07-08 DIAGNOSIS — R200 Anesthesia of skin: Secondary | ICD-10-CM | POA: Diagnosis not present

## 2021-07-08 DIAGNOSIS — R202 Paresthesia of skin: Secondary | ICD-10-CM

## 2021-07-08 HISTORY — DX: Encounter for immunization: Z23

## 2021-07-08 HISTORY — DX: Muscle weakness (generalized): M62.81

## 2021-07-08 MED ORDER — ZOSTER VAC RECOMB ADJUVANTED 50 MCG/0.5ML IM SUSR: 0.5000 mL | Freq: Once | INTRAMUSCULAR | 1 refills | Status: AC

## 2021-07-08 NOTE — Progress Notes (Signed)
New Patient Office Visit  Subjective:  Patient ID: Jaclyn Spencer, female    DOB: 07/15/1956  Age: 65 y.o. MRN: [id number]  CC:  Chief Complaint  Patient presents with   Establish Care    Patient states she has numbness in her toes for one month or more.      HPI Jaclyn Spencer presents to establish care.   Numbness in toes: She stated that the numbness in toes started a month ago. It started in her right foot, great toe and then a week later, it started in her left foot, great toe and third toe. She denies any injury to the site. She has not noticed any alleviating or aggravating factors. She reports that it could be from the shoes and is trying to rule that out.   Left-sided muscle weakness: She reports that she started to have left sided muscle weakness a long time ago. She has seen a neurologist about 15 years ago and had a full work-up done. She states that she has noticed that is getting gradually worse. She denies any recent falls or any injuries.   Past Medical History:  Diagnosis Date   Abdominal pain    Abdominal pain, left upper quadrant 02/27/2010   Qualifier: Diagnosis of  By: Olevia Perches MD, Lowella Bandy    Abdominal pain, right lower quadrant 05/29/2010   Qualifier: Diagnosis of  By: Birdie Riddle MD, Katherine     Allergy    rhinitis   Anxiety    B12 deficiency    Basal cell carcinoma    CHEST PAIN 05/29/2010   Qualifier: Diagnosis of  By: Birdie Riddle MD, Belenda Cruise     Diverticulosis    Epigastric pain 08/27/2011   GERD (gastroesophageal reflux disease)    Shoulder pain 08/27/2011   Tick bite 03/08/2012   Vitamin D deficiency    Weight loss     Past Surgical History:  Procedure Laterality Date   ESOPHAGOGASTRODUODENOSCOPY (EGD) WITH PROPOFOL N/A 12/03/2020   Procedure: ESOPHAGOGASTRODUODENOSCOPY (EGD) WITH PROPOFOL;  Surgeon: Mauri Pole, MD;  Location: [facility];  Service: Endoscopy;  Laterality: N/A;   MOHS SURGERY     nose    Family History  Problem Relation Age of Onset    Colitis Mother    Inflammatory bowel disease Mother    Coronary artery disease Mother 55       Stents   Heart attack Mother    Kidney cancer Mother    Lung cancer Father    Coronary artery disease Paternal Grandfather    Stomach cancer Paternal Grandmother    Stomach cancer Paternal Aunt    Arthritis Sister    Cancer Brother        melanoma   Diabetes Neg Hx    Hyperlipidemia Neg Hx    Hypertension Neg Hx    Sudden death Neg Hx    Esophageal cancer Neg Hx    Pancreatic cancer Neg Hx    Liver disease Neg Hx    Colon cancer Neg Hx     Social History   Socioeconomic History   Marital status: Married    Spouse name: Not on file   Number of children: 0   Years of education: Not on file   Highest education level: Not on file  Occupational History   Not on file  Tobacco Use   Smoking status: Former    Types: Cigarettes    Quit date: 09/28/1976    Years since quitting: 44.8   Smokeless tobacco:  Never  Vaping Use   Vaping Use: Never used  Substance and Sexual Activity   Alcohol use: Yes    Comment: occ   Drug use: No   Sexual activity: Not Currently  Other Topics Concern   Not on file  Social History Narrative   Not on file   Social Determinants of Health   Financial Resource Strain: Not on file  Food Insecurity: Not on file  Transportation Needs: Not on file  Physical Activity: Not on file  Stress: Not on file  Social Connections: Not on file  Intimate Partner Violence: Not on file    ROS Review of Systems  Constitutional: Negative.   Respiratory: Negative.    Cardiovascular: Negative.   Gastrointestinal: Negative.   Endocrine: Negative.   Genitourinary: Negative.   Skin: Negative.   Neurological:  Positive for weakness and numbness. Negative for dizziness.  Psychiatric/Behavioral: Negative.     Objective:   Today's Vitals: BP 124/78   Pulse 81   Ht 5\' 6"  (1.676 m)   Wt 125 lb 12.8 oz (57.1 kg)   SpO2 100%   BMI 20.30 kg/m   Physical  Exam Vitals reviewed.  Constitutional:      Appearance: Normal appearance.  Cardiovascular:     Rate and Rhythm: Normal rate and regular rhythm.     Pulses: Normal pulses.     Heart sounds: Normal heart sounds.  Pulmonary:     Breath sounds: Normal breath sounds.  Abdominal:     General: Bowel sounds are normal.  Musculoskeletal:        General: Normal range of motion.  Neurological:     Mental Status: She is alert and oriented to person, place, and time.     Motor: Weakness present.  Psychiatric:        Mood and Affect: Mood normal.        Behavior: Behavior normal.    Assessment & Plan:   Problem List Items Addressed This Visit       Digestive   Gastroesophageal reflux disease with esophagitis without hemorrhage    Currently managing with diet and exercise. For flare ups, she takes OTC Pepcid. Has a prescription for Protonix, as needed.         Other   Numbness and tingling    Numbness in both great toes. Capillary refill less than three seconds. Pinprick test done and was negative. She does think that it could possibly from her shoes. Agreeable to continue to monitor symptoms and let us know if symptoms do not get better or worsen.      Left-sided muscle weakness    Left sided weakness noticed in the lower extremities on exam. She has tried physical therapy about 15 years ago but is agreeable to try again. If this does not seem to get better, possibly consider a referral to neurologist.       Relevant Orders   Ambulatory referral to Physical Therapy   Need for shingles vaccine   Relevant Medications   Zoster Vaccine Adjuvanted [facility]) injection   Screening mammogram for breast cancer - Primary   Relevant Orders   MM Digital Screening    Outpatient Encounter Medications as of 07/08/2021  Medication Sig   azelastine (ASTELIN) 0.1 % nasal spray Place 1 spray into both nostrils daily as needed for allergies or rhinitis.   dicyclomine (BENTYL) 10 MG capsule  Take 1 capsule (10 mg total) by mouth every 8 (eight) hours as needed for spasms.  hydrocortisone (ANUSOL-HC) 2.5 % rectal cream Place 1 application rectally at bedtime as needed for hemorrhoids or anal itching.   loratadine (CLARITIN) 10 MG tablet Take 10 mg by mouth daily as needed for allergies.   Multiple Vitamins-Minerals (MULTIVITAMIN WITH MINERALS) tablet Take 1 tablet by mouth 3 (three) times a week.   pantoprazole (PROTONIX) 20 MG tablet Take 1 tablet (20 mg total) by mouth daily.   Zoster Vaccine Adjuvanted [facility]) injection Inject 0.5 mLs into the muscle once for 1 dose. Repeat in 2-6 months. Please fax confirmation of vaccination to Worthy Keeler, DNP at [phone]   No facility-administered encounter medications on file as of 07/08/2021.    Follow-up: Return for CPE in 2-3 weeks.   Tinnie Gens, BSN-RN, DNP STUDENT   Time: 48 minutes, >50% spent counseling, care coordination, chart review, and documentation.   Patient seen along with NP student, Tinnie Gens at today's visit. Portions of documentation and assessment have been completed by the student listed. Porfirio Oar, NP, have reviewed all documentation completed by the student. The documentation on 07/08/21 for the exam, diagnosis, procedures, and orders are all accurate and complete.  Porfirio Oar, NP,have personally seen and evaluated the patient during this encounter.  While the patient was in clinic, I reviewed the patient's medical history, the student's findings on physical examination, and the patient's diagnosis and treatment plan. All aspects of care were discussed with the the student and I agree with the information documented.   Aneira Cavitt, Coralee Pesa, NP, DNp, AGNP-c Primary [facility] & [facility] Medicine at [place]

## 2021-07-08 NOTE — Assessment & Plan Note (Signed)
Currently managing with diet and exercise. For flare ups, she takes OTC Pepcid. Has a prescription for Protonix, as needed.

## 2021-07-08 NOTE — Assessment & Plan Note (Signed)
Numbness in both great toes. Capillary refill less than three seconds. Pinprick test done and was negative. She does think that it could possibly from her shoes. Agreeable to continue to monitor symptoms and let us know if symptoms do not get better or worsen.

## 2021-07-08 NOTE — Progress Notes (Deleted)
[name], DNP, AGNP-c Primary Care & Sports Medicine [address]  [place], [place] [address] [phone]  New patient visit   Patient: Jaclyn Spencer   DOB: 1956-04-17   65 y.o. Female  MRN: [id number] Visit Date: 07/08/2021  Patient Care Team: Martinique, Betty G, MD as PCP - General (Family Medicine)  Today's healthcare provider: [name], NP   No chief complaint on file.  Subjective    Jaclyn Spencer is a 65 y.o. female who presents today as a new patient to establish care.  HPI  ***  Past Medical History:  Diagnosis Date   Abdominal pain    Allergy    rhinitis   Anxiety    B12 deficiency    Basal cell carcinoma    Diverticulosis    GERD (gastroesophageal reflux disease)    Vitamin D deficiency    Weight loss    Past Surgical History:  Procedure Laterality Date   ESOPHAGOGASTRODUODENOSCOPY (EGD) WITH PROPOFOL N/A 12/03/2020   Procedure: ESOPHAGOGASTRODUODENOSCOPY (EGD) WITH PROPOFOL;  Surgeon: Mauri Pole, MD;  Location: [facility];  Service: Endoscopy;  Laterality: N/A;   MOHS SURGERY     nose   Family Status  Relation Name Status   Mother  Deceased   Father  Deceased   PGF  (Not Specified)   PGM  (Not Specified)   Ethlyn Daniels  (Not Specified)   Sister Kendell Bane   Brother DJ Alive   Brother  Alive   Neg Hx  (Not Specified)   Family History  Problem Relation Age of Onset   Colitis Mother    Inflammatory bowel disease Mother    Coronary artery disease Mother 39       Stents   Heart attack Mother    Kidney cancer Mother    Lung cancer Father    Coronary artery disease Paternal Grandfather    Stomach cancer Paternal Grandmother    Stomach cancer Paternal Aunt    Arthritis Sister    Cancer Brother        melanoma   Diabetes Neg Hx    Hyperlipidemia Neg Hx    Hypertension Neg Hx    Sudden death Neg Hx    Esophageal cancer Neg Hx    Pancreatic cancer Neg Hx    Liver disease Neg Hx    Colon cancer Neg  Hx    Social History   Socioeconomic History   Marital status: Married    Spouse name: Not on file   Number of children: 0   Years of education: Not on file   Highest education level: Not on file  Occupational History   Not on file  Tobacco Use   Smoking status: Former    Types: Cigarettes    Quit date: 09/28/1976    Years since quitting: 44.8   Smokeless tobacco: Never  Vaping Use   Vaping Use: Never used  Substance and Sexual Activity   Alcohol use: Yes    Comment: occ   Drug use: No   Sexual activity: Not Currently  Other Topics Concern   Not on file  Social History Narrative   Not on file   Social Determinants of Health   Financial Resource Strain: Not on file  Food Insecurity: Not on file  Transportation Needs: Not on file  Physical Activity: Not on file  Stress: Not on file  Social Connections: Not on file   Outpatient Medications Prior to Visit  Medication  Sig   azelastine (ASTELIN) 0.1 % nasal spray Place 1 spray into both nostrils daily as needed for allergies or rhinitis.   dicyclomine (BENTYL) 10 MG capsule Take 1 capsule (10 mg total) by mouth every 8 (eight) hours as needed for spasms.   hydrocortisone (ANUSOL-HC) 2.5 % rectal cream Place 1 application rectally at bedtime as needed for hemorrhoids or anal itching.   loratadine (CLARITIN) 10 MG tablet Take 10 mg by mouth daily as needed for allergies.   Multiple Vitamins-Minerals (MULTIVITAMIN WITH MINERALS) tablet Take 1 tablet by mouth 3 (three) times a week.   pantoprazole (PROTONIX) 20 MG tablet Take 1 tablet (20 mg total) by mouth daily.   No facility-administered medications prior to visit.   No Known Allergies  Immunization History  Administered Date(s) Administered   PFIZER(Purple Top)SARS-COV-2 Vaccination 11/10/2019, 12/03/2019   Tdap 04/16/2021    Health Maintenance  Topic Date Due   PAP SMEAR-Modifier  Never done   Zoster Vaccines- Shingrix (1 of 2) Never done   MAMMOGRAM  06/28/2016    COVID-19 Vaccine (3 - Booster for Pfizer series) 05/04/2020   INFLUENZA VACCINE  Never done   HIV Screening  03/15/2025 (Originally 08/31/1971)   COLONOSCOPY (Pts 45-85yrs Insurance coverage will need to be confirmed)  10/11/2023   TETANUS/TDAP  04/17/2031   Hepatitis C Screening  Completed   HPV VACCINES  Aged Out    Patient Care Team: Martinique, Betty G, MD as PCP - General (Family Medicine)  Review of Systems All review of systems negative except what is listed in the HPI    Objective    There were no vitals taken for this visit. Physical Exam ***  Depression Screen PHQ 2/9 Scores 03/16/2020 03/16/2020 09/27/2012  PHQ - 2 Score 1 1 0  PHQ- 9 Score 4 - -   No results found for any visits on 07/08/21.  Assessment & Plan      Problem List Items Addressed This Visit   None    No follow-ups on file.      Derrico Zhong, Coralee Pesa, NP, DNP, AGNP-C [facility] at [place]

## 2021-07-08 NOTE — Assessment & Plan Note (Signed)
Left sided weakness noticed in the lower extremities on exam. She has tried physical therapy about 15 years ago but is agreeable to try again. If this does not seem to get better, possibly consider a referral to neurologist.

## 2021-07-08 NOTE — Patient Instructions (Signed)
Recommendations from today's visit: We will try physical therapy to see if this helps with the weakness on the left side. We can always send a referral to a different neurologist for further evaluation.  I have sent the mammogram order in, they will call you to schedule this.  If your toes do not seem to improve, let me know and we can look into that further .  Information on diet, exercise, and health maintenance recommendations are listed below. This is information to help you be sure you are on track for optimal health and monitoring.   Please look over this and let us know if you have any questions or if you have completed any of the health maintenance outside of [facility] so that we can be sure your records are up to date.  ___________________________________________________________  Thank you for choosing [facility] for your Primary Care needs. I am excited for the opportunity to partner with you to meet your health care goals. It was a pleasure meeting you today!  I am an Adult-Geriatric Nurse Practitioner with a background in caring for patients for more than 20 years. I provide primary care and sports medicine services to patients age 34 and older within this office. I am also the director of the APP Fellowship with [facility].   I am passionate about providing the best service to you through preventive medicine and supportive care. I consider you a part of the medical team and value your input. I work diligently to ensure that you are heard and your needs are met in a safe and effective manner. I want you to feel comfortable with me as your provider and want you to know that your health concerns are important to me.  For your information, our office hours are Monday- Friday 8:00 AM - 5:00 PM At this time I am not in the office on Wednesdays.  If you have questions or concerns, please call our office at [phone] or send [place] a MyChart message and  we will respond as quickly as possible.   For all urgent or time sensitive needs we ask that you please call the office to avoid delays. MyChart is not constantly monitored and replies may take up to 72 business hours.  MyChart Policy: MyChart allows for you to see your visit notes, after visit summary, provider recommendations, lab and tests results, make an appointment, request refills, and contact your provider or the office for non-urgent questions or concerns. Providers are seeing patients during normal business hours and do not have built in time to review MyChart messages.  We ask that you allow a minimum of 4 business days for responses to [employer]. For this reason, please do not send urgent requests through [facility]. Please call the office at [phone]. Complex MyChart concerns may require a visit. Your provider may request you schedule a virtual or in person visit to ensure we are providing the best care possible. MyChart messages sent after 4:00 PM on Friday will not be received by the provider until Monday morning.    Lab and Test Results: You will receive your lab and test results on MyChart as soon as they are completed and results have been sent by the lab or testing facility. Due to this service, you will receive your results BEFORE your provider.  I review lab and tests results each morning prior to seeing patients. Some results require collaboration with other providers to ensure you are receiving the  most appropriate care. For this reason, we ask that you please allow a minimum of 4 business days for your provider to receive and review lab and test results and contact you about these.  Most lab and test result comments from the provider will be sent through [place]. Your provider may recommend changes to the plan of care, follow-up visits, repeat testing, ask questions, or request an office visit to discuss these results. You may reply directly to this message or call the  office at [phone] to provide information for the provider or set up an appointment. In some instances, you will be called with test results and recommendations. Please let us know if this is preferred and we will make note of this in your chart to provide this for you.    If you have not heard a response to your lab or test results in 72 business hours, please call the office to let us know.   After Hours: For all non-emergency after hours needs, please call the office at [phone] and select the option to reach the on-call provider service. On-call services are shared between multiple [facility] offices and therefore it will not be possible to speak directly with your provider. On-call providers may provide medical advice and recommendations, but are unable to provide refills for maintenance medications.  For all emergency or urgent medical needs after normal business hours, we recommend that you seek care at the closest Urgent Care or Emergency Department to ensure appropriate treatment in a timely manner.  [facility] has a 24 hour emergency room located on the ground floor for your convenience.    Please do not hesitate to reach out to [place] with concerns.   Thank you, again, for choosing me as your health care partner. I appreciate your trust and look forward to learning more about you.   Worthy Keeler, DNP, AGNP-c ___________________________________________________________  Health Maintenance Recommendations Screening Testing Mammogram Every 1 -2 years based on history and risk factors Starting at age 83 Pap Smear Ages 21-39 every 3 years Ages 46-65 every 5 years with HPV testing More frequent testing may be required based on results and history Colon Cancer Screening Every 1-10 years based on test performed, risk factors, and history Starting at age 26 Bone Density Screening Every 2-10 years based on history Starting at age 78 for  women Recommendations for men differ based on medication usage, history, and risk factors AAA Screening One time ultrasound Men 87-20 years old who have every smoked Lung Cancer Screening Low Dose Lung CT every 12 months Age 57-80 years with a 30 pack-year smoking history who still smoke or who have quit within the last 15 years  Screening Labs Routine  Labs: Complete Blood Count (CBC), Complete Metabolic Panel (CMP), Cholesterol (Lipid Panel) Every 6-12 months based on history and medications May be recommended more frequently based on current conditions or previous results Hemoglobin A1c Lab Every 3-12 months based on history and previous results Starting at age 64 or earlier with diagnosis of diabetes, high cholesterol, BMI >26, and/or risk factors Frequent monitoring for patients with diabetes to ensure blood sugar control Thyroid Panel (TSH w/ T3 & T4) Every 6 months based on history, symptoms, and risk factors May be repeated more often if on medication HIV One time testing for all patients 72 and older May be repeated more frequently for patients with increased risk factors or exposure Hepatitis C One time testing for all patients 24 and older May  be repeated more frequently for patients with increased risk factors or exposure Gonorrhea, Chlamydia Every 12 months for all sexually active persons 13-24 years Additional monitoring may be recommended for those who are considered high risk or who have symptoms PSA Men 49-51 years old with risk factors Additional screening may be recommended from age 65-69 based on risk factors, symptoms, and history  Vaccine Recommendations Tetanus Booster All adults every 10 years Flu Vaccine All patients 6 months and older every year COVID Vaccine All patients 12 years and older Initial dosing with booster May recommend additional booster based on age and health history HPV Vaccine 2 doses all patients age 80-26 Dosing may be considered  for patients over 26 Shingles Vaccine (Shingrix) 2 doses all adults 57 years and older Pneumonia (Pneumovax 23) All adults 23 years and older May recommend earlier dosing based on health history Pneumonia (Prevnar 41) All adults 65 years and older Dosed 1 year after Pneumovax 23  Additional Screening, Testing, and Vaccinations may be recommended on an individualized basis based on family history, health history, risk factors, and/or exposure.  __________________________________________________________  Diet Recommendations for All Patients  I recommend that all patients maintain a diet low in saturated fats, carbohydrates, and cholesterol. While this can be challenging at first, it is not impossible and small changes can make big differences.  Things to try: Decreasing the amount of soda, sweet tea, and/or juice to one or less per day and replace with water While water is always the first choice, if you do not like water you may consider adding a water additive without sugar to improve the taste other sugar free drinks Replace potatoes with a brightly colored vegetable at dinner Use healthy oils, such as canola oil or olive oil, instead of butter or hard margarine Limit your bread intake to two pieces or less a day Replace regular pasta with low carb pasta options Bake, broil, or grill foods instead of frying Monitor portion sizes  Eat smaller, more frequent meals throughout the day instead of large meals  An important thing to remember is, if you love foods that are not great for your health, you don't have to give them up completely. Instead, allow these foods to be a reward when you have done well. Allowing yourself to still have special treats every once in a while is a nice way to tell yourself thank you for working hard to keep yourself healthy.   Also remember that every day is a new day. If you have a bad day and "fall off the wagon", you can still climb right back up and keep  moving along on your journey!  We have resources available to help you!  Some websites that may be helpful include: www.http://carter.biz/  Www.VeryWellFit.com _____________________________________________________________  Activity Recommendations for All Patients  I recommend that all adults get at least 20 minutes of moderate physical activity that elevates your heart rate at least 5 days out of the week.  Some examples include: Walking or jogging at a pace that allows you to carry on a conversation Cycling (stationary bike or outdoors) Water aerobics Yoga Weight lifting Dancing If physical limitations prevent you from putting stress on your joints, exercise in a pool or seated in a chair are excellent options.  Do determine your MAXIMUM heart rate for activity: YOUR AGE - 220 = MAX HeartRate   Remember! Do not push yourself too hard.  Start slowly and build up your pace, speed, weight, time in exercise, etc.  Allow your body to rest between exercise and get good sleep. You will need more water than normal when you are exerting yourself. Do not wait until you are thirsty to drink. Drink with a purpose of getting in at least 8, 8 ounce glasses of water a day plus more depending on how much you exercise and sweat.    If you begin to develop dizziness, chest pain, abdominal pain, jaw pain, shortness of breath, headache, vision changes, lightheadedness, or other concerning symptoms, stop the activity and allow your body to rest. If your symptoms are severe, seek emergency evaluation immediately. If your symptoms are concerning, but not severe, please let us know so that we can recommend further evaluation.   ________________________________________________________________

## 2021-07-23 NOTE — Therapy (Addendum)
OUTPATIENT PHYSICAL THERAPY LOWER EXTREMITY EVALUATION AND DISCHARGE  PHYSICAL THERAPY DISCHARGE SUMMARY  Visits from Start of Care: 1  Plan: Patient agrees to discharge.  Patient goals were not met. Patient is being discharged due to not returning to therapy.        Patient Name: Jaclyn Spencer MRN: [id number] DOB:September 26, 1956, 65 y.o., female Today's Date: 07/24/2021   PT End of Session - 07/24/21 1053     Visit Number 1    Number of Visits 9    Date for PT Re-Evaluation 10/22/21    Authorization Type [employer]    PT Start Time 0845    PT Stop Time 0930    PT Time Calculation (min) 45 min    Activity Tolerance Patient tolerated treatment well    Behavior During Therapy [facility] for tasks assessed/performed             Past Medical History:  Diagnosis Date   Abdominal pain    Abdominal pain, left upper quadrant 02/27/2010   Qualifier: Diagnosis of  By: Olevia Perches MD, Lowella Bandy    Abdominal pain, right lower quadrant 05/29/2010   Qualifier: Diagnosis of  By: Birdie Riddle MD, Katherine     Allergy    rhinitis   Anxiety    B12 deficiency    Basal cell carcinoma    CHEST PAIN 05/29/2010   Qualifier: Diagnosis of  By: Birdie Riddle MD, Katherine     Diverticulosis    Epigastric pain 08/27/2011   GERD (gastroesophageal reflux disease)    Shoulder pain 08/27/2011   Tick bite 03/08/2012   Vitamin D deficiency    Weight loss    Past Surgical History:  Procedure Laterality Date   ESOPHAGOGASTRODUODENOSCOPY (EGD) WITH PROPOFOL N/A 12/03/2020   Procedure: ESOPHAGOGASTRODUODENOSCOPY (EGD) WITH PROPOFOL;  Surgeon: Mauri Pole, MD;  Location: [facility];  Service: Endoscopy;  Laterality: N/A;   MOHS SURGERY     nose   Patient Active Problem List   Diagnosis Date Noted   Left-sided muscle weakness 07/08/2021   Need for shingles vaccine 07/08/2021   Screening mammogram for breast cancer 07/08/2021   Esophageal dysphagia    Gastroesophageal reflux disease with esophagitis without hemorrhage     Insomnia 07/16/2016   Encounter for screening colonoscopy 07/16/2016   Slow transit constipation 06/30/2016   Osteoporosis 06/27/2014   Eustachian tube dysfunction 03/08/2012   Seasonal allergic rhinitis 03/08/2012   Numbness and tingling 08/27/2011   General medical examination 08/27/2011   GASTROENTERITIS 12/12/2010   Vitamin D deficiency, unspecified 07/24/2010   B12 deficiency 03/03/2010   LOSS OF WEIGHT 02/27/2010    PCP: [name], NP  REFERRING PROVIDER: [name], NP  REFERRING DIAG: M62.81 (ICD-10-CM) - Left-sided muscle weakness  THERAPY DIAG:  Muscle weakness (generalized)  Right shoulder pain, unspecified chronicity  ONSET DATE: 10-15 years ago  SUBJECTIVE:   SUBJECTIVE STATEMENT: Pt states the weakness has progressed over the last several years. She states it is "not horrible but it is very noticeable." She notices that there is less muscle mass on the L side. She notices decreased L grip strength. She notices less strength and balance on L LE. Pt does not notice the weakness gets worse with periods of activity or inactivity. Pt does not notice eye drooping. Pt denies progression of weakness throughout the day. Pt notices it the most with more strenuous activity. Pt does have history dysphagia. Pt denies diploplia or dysarthria. Pt did NCV testing and MRIs that came back negative. Pt  reports MD did note a disc issue. Pt will have occasional back pain with walking but nothing consistent. Pt does enjoy yoga, walking, and swimming. Pt does not currently have access to a gym/exercise facility. Pt does endorse NT into bilateral toes with occasional NT into back of the hand and arm. She reports that the arm "just feels different." Pt denies family history of similar issues. Pt denies have blood tests done related to this issue.   PERTINENT HISTORY: N/A  PAIN:   Aggravating factors: None Relieving factors: None  PRECAUTIONS: None  WEIGHT BEARING RESTRICTIONS  No  FALLS:  Has patient fallen in last 6 months? No,   LIVING ENVIRONMENT: Lives with: lives with their family Lives in: House/apartment Has following equipment at home: None  OCCUPATION: N/A  PLOF: Independent  PATIENT GOALS Pt states biggest concern is prevention of degradation of mobility. Maintaining mobility.   OBJECTIVE:   DIAGNOSTIC FINDINGS: N/A  PATIENT SURVEYS:  FOTO 81 D/C 75   COGNITION:  Overall cognitive status: Within functional limits for tasks assessed     SENSATION:  Light touch: Deficits      POSTURE:  Slight forward shoulder rounding in standing. Otherwise L/S and C/S [facility]  LE AROM/PROM:  Grossly WNL bilaterally with functional movement screening in UE and LE  Pain with R shoulder during BHB ER and IR reaching  LE Dynamometer:  MMT Right 07/24/2021 Left 07/24/2021  Hip flexion 25.8 21.3 lbs  Hip extension 29.4 27.1  Hip abduction 39.0 35.1  Knee extension 32.5  27.2   (Blank rows = not tested)  R grip: 40lbs, 40, 45 L grip: 40 lbs,  40, 45 lbs  SPECIAL TESTS:  Hoffman's (-) negative  DTR: C6 (3) bilat, L3 (2) bilat (-) Dysdiadochokinesia/RAM (-) Spurling's (-) Distraction   PALPATION:  R UT hypertonicity  FUNCTIONAL TESTS:  5 times sit to stand: 12.5s  5X BW squats: decrease hip ER and ABD, increased fwd trunk lean, lumbar rounding at end range; able to hit full depth. LOB on 5th repetition with noted weakness rising from bottom position 4 Stage Balance: WNL in all stages, increased postural sway with start of SLS on L and R  GAIT: Distance walked: 51ft Assistive device utilized: None Level of assistance: Complete Independence Comments: WNL    TODAY'S TREATMENT: Exercises Sit to Stand Without Arm Support - 1 x daily - 3-4 x weekly - 3 sets - 10 reps Shoulder External Rotation and Scapular Retraction with Resistance - 1 x daily - 3-4 x weekly - 3 sets - 10 reps Wall Push Up - 1 x daily - 3-4 x weekly - 3 sets - 10  reps Standing Shoulder Posterior Capsule Stretch - 2 x daily - 7 x weekly - 1 sets - 3 reps - 30 hold    PATIENT EDUCATION:  Education details: exam findings, diagnosis, prognosis, anatomy, exercise progression, DOMS expectations, muscle firing,  envelope of function, HEP, POC  Person educated: Patient Education method: Explanation, Demonstration, Tactile cues, Verbal cues, and Handouts Education comprehension: verbalized understanding, returned demonstration, verbal cues required, tactile cues required, and needs further education   HOME EXERCISE PROGRAM: Access Code: [facility] URL: [email or web address] Date: 07/24/2021 Prepared by: Daleen Bo  ASSESSMENT:  CLINICAL IMPRESSION: Patient is a 65 y.o. female who was seen today for physical therapy evaluation and treatment for CC of L sided weakness. Pt's s/s could not be reproduced with clinical testing at today's session but objective force output measures do show a strength discrepancy  between L and R LE. Clinical testing and subjective report does not suggest neurological decline. Plan to set pt up with self strengthening program for prevention of further functional decline. Objective impairments include decreased ROM, decreased strength, hypomobility, impaired flexibility, improper body mechanics, and postural dysfunction. These impairments are limiting patient from  full participation in exercise, community mobility, and recreational activity . Personal factors including Age, Time since onset of injury/illness/exacerbation, 1 comorbidity:  , and finances  are also affecting patient's functional outcome. Patient will benefit from skilled PT to address above impairments and improve overall function.  REHAB POTENTIAL: Fair    CLINICAL DECISION MAKING: Evolving/moderate complexity  EVALUATION COMPLEXITY: Moderate   GOALS:   SHORT TERM GOALS:  STG Name Target Date Goal status  1 Pt will become independent with HEP in  order to demonstrate synthesis of PT education.   08/07/2021 INITIAL  2 Pt will be able to demonstrate within 90% MMT of L and R LE in order to demonstrate functional improvement in LE strength for prevention of functional decline.  08/21/2021 INITIAL  3 Pt will be able to demonstrate 5X BW squats to full depth without LOB in order to demonstrate functional improvement in lumbopelvic function for exercise and house hold duties.   08/21/2021 INITIAL  4 Pt will be able to perform 5XSTS in under 12s  in order to demonstrate functional improvement above the cut off score for adults.   08/21/2021 INITIAL   LONG TERM GOALS:  To be set if/when pt is able to return to PT for assessment of STG.   PLAN: PT FREQUENCY: 1-2x/week  PT DURATION: 8 weeks  PLANNED INTERVENTIONS: Therapeutic exercises, Therapeutic activity, Neuro Muscular re-education, Balance training, Gait training, Patient/Family education, Joint mobilization, Stair training, Aquatic Therapy, Dry Needling, Electrical stimulation, Spinal mobilization, Cryotherapy, Moist heat, Taping, Vasopneumatic device, Traction, Ultrasound, Ionotophoresis 4mg /ml Dexamethasone, and Manual therapy  PLAN FOR NEXT SESSION: Pt limited due to insurance coverage of PT visits. Plan to keep pt case open for 30 days or until pt able to contact therapy clinic for future scheduling.   Daleen Bo PT, DPT 07/24/21 10:54 AM

## 2021-07-24 ENCOUNTER — Encounter (HOSPITAL_BASED_OUTPATIENT_CLINIC_OR_DEPARTMENT_OTHER): Payer: Self-pay | Admitting: Physical Therapy

## 2021-07-24 ENCOUNTER — Other Ambulatory Visit: Payer: Self-pay

## 2021-07-24 ENCOUNTER — Ambulatory Visit (HOSPITAL_BASED_OUTPATIENT_CLINIC_OR_DEPARTMENT_OTHER): Payer: BC Managed Care – PPO | Attending: Nurse Practitioner | Admitting: Physical Therapy

## 2021-07-24 DIAGNOSIS — M25511 Pain in right shoulder: Secondary | ICD-10-CM | POA: Insufficient documentation

## 2021-07-24 DIAGNOSIS — M6281 Muscle weakness (generalized): Secondary | ICD-10-CM | POA: Diagnosis not present

## 2021-08-14 ENCOUNTER — Other Ambulatory Visit: Payer: Self-pay

## 2021-08-14 ENCOUNTER — Ambulatory Visit (INDEPENDENT_AMBULATORY_CARE_PROVIDER_SITE_OTHER): Payer: BC Managed Care – PPO | Admitting: Nurse Practitioner

## 2021-08-14 ENCOUNTER — Other Ambulatory Visit (HOSPITAL_COMMUNITY)
Admission: RE | Admit: 2021-08-14 | Discharge: 2021-08-14 | Disposition: A | Discharge status: Home / Self Care | Payer: BC Managed Care – PPO | Source: Ambulatory Visit | Attending: Nurse Practitioner | Admitting: Nurse Practitioner

## 2021-08-14 ENCOUNTER — Encounter (HOSPITAL_BASED_OUTPATIENT_CLINIC_OR_DEPARTMENT_OTHER): Payer: Self-pay | Admitting: Nurse Practitioner

## 2021-08-14 ENCOUNTER — Telehealth (HOSPITAL_BASED_OUTPATIENT_CLINIC_OR_DEPARTMENT_OTHER): Payer: Self-pay | Admitting: Nurse Practitioner

## 2021-08-14 VITALS — BP 117/78 | HR 95 | Ht 66.0 in | Wt 127.2 lb

## 2021-08-14 DIAGNOSIS — N898 Other specified noninflammatory disorders of vagina: Secondary | ICD-10-CM

## 2021-08-14 DIAGNOSIS — R202 Paresthesia of skin: Secondary | ICD-10-CM

## 2021-08-14 DIAGNOSIS — M6281 Muscle weakness (generalized): Secondary | ICD-10-CM

## 2021-08-14 DIAGNOSIS — Z0000 Encounter for general adult medical examination without abnormal findings: Secondary | ICD-10-CM

## 2021-08-14 DIAGNOSIS — Z124 Encounter for screening for malignant neoplasm of cervix: Secondary | ICD-10-CM | POA: Insufficient documentation

## 2021-08-14 DIAGNOSIS — E538 Deficiency of other specified B group vitamins: Secondary | ICD-10-CM | POA: Diagnosis not present

## 2021-08-14 DIAGNOSIS — K2100 Gastro-esophageal reflux disease with esophagitis, without bleeding: Secondary | ICD-10-CM

## 2021-08-14 DIAGNOSIS — Z13228 Encounter for screening for other metabolic disorders: Secondary | ICD-10-CM

## 2021-08-14 DIAGNOSIS — E7800 Pure hypercholesterolemia, unspecified: Secondary | ICD-10-CM

## 2021-08-14 DIAGNOSIS — R200 Anesthesia of skin: Secondary | ICD-10-CM | POA: Diagnosis not present

## 2021-08-14 DIAGNOSIS — E559 Vitamin D deficiency, unspecified: Secondary | ICD-10-CM

## 2021-08-14 DIAGNOSIS — Z130 Encounter for screening for diseases of the blood and blood-forming organs and certain disorders involving the immune mechanism: Secondary | ICD-10-CM

## 2021-08-14 DIAGNOSIS — Z1329 Encounter for screening for other suspected endocrine disorder: Secondary | ICD-10-CM

## 2021-08-14 MED ORDER — PANTOPRAZOLE SODIUM 20 MG PO TBEC: 20.0000 mg | DELAYED_RELEASE_TABLET | Freq: Every day | ORAL | 3 refills | Status: DC

## 2021-08-14 NOTE — Addendum Note (Signed)
Addended by: Campbell Riches on: 08/14/2021 03:44 PM   Modules accepted: Orders

## 2021-08-14 NOTE — Progress Notes (Deleted)
There were no vitals taken for this visit.   Subjective:    Patient ID: Jaclyn Spencer, female    DOB: 1956/07/13, 65 y.o.   MRN: [id number]  HPI: Jaclyn Spencer is a 65 y.o. female presenting on 08/14/2021 for comprehensive medical examination.   Current medical concerns include:{Blank single:19197::"none","***"}  Past Medical History:  Past Medical History:  Diagnosis Date   Abdominal pain    Abdominal pain, left upper quadrant 02/27/2010   Qualifier: Diagnosis of  By: Olevia Perches MD, Lowella Bandy    Abdominal pain, right lower quadrant 05/29/2010   Qualifier: Diagnosis of  By: Birdie Riddle MD, Katherine     Allergy    rhinitis   Anxiety    B12 deficiency    Basal cell carcinoma    CHEST PAIN 05/29/2010   Qualifier: Diagnosis of  By: Birdie Riddle MD, Belenda Cruise     Diverticulosis    Epigastric pain 08/27/2011   GERD (gastroesophageal reflux disease)    Shoulder pain 08/27/2011   Tick bite 03/08/2012   Vitamin D deficiency    Weight loss    Medications:  Current Outpatient Medications on File Prior to Visit  Medication Sig   azelastine (ASTELIN) 0.1 % nasal spray Place 1 spray into both nostrils daily as needed for allergies or rhinitis.   dicyclomine (BENTYL) 10 MG capsule Take 1 capsule (10 mg total) by mouth every 8 (eight) hours as needed for spasms.   hydrocortisone (ANUSOL-HC) 2.5 % rectal cream Place 1 application rectally at bedtime as needed for hemorrhoids or anal itching.   loratadine (CLARITIN) 10 MG tablet Take 10 mg by mouth daily as needed for allergies.   Multiple Vitamins-Minerals (MULTIVITAMIN WITH MINERALS) tablet Take 1 tablet by mouth 3 (three) times a week.   pantoprazole (PROTONIX) 20 MG tablet Take 1 tablet (20 mg total) by mouth daily.   No current facility-administered medications on file prior to visit.    She currently lives with: Interim Problems from her last visit: {Blank single:19197::"yes","no"}   She reports regular vision exams q1-5y: {Blank  single:19197::"yes","no"} She reports regular dental exams q 59m: {Blank single:19197::"yes","no"} Her diet consists of: {Blank single:19197::"***"} She endorses exercise and/or activity of: {Blank single:19197::"***"} She works at: [employer] single:19197::"***"}  She {Blank single:19197::"denies","endorses"} ETOH use of {Blank single:19197::"***"} She {Blank single:19197::"denies","endorses"} nictoine use of {Blank single:19197::"***"} She {Blank single:19197::"denies","endorses"} illegal substance use of {Blank single:19197::"***"}  She is {Menopause:31378} She reports {Regular/irregular menstrual period abdominal pain hpi md:30583} Current menopausal symptoms: {Blank single:19197::"yes","no"} {Menopause s/s:16166}  She is {Blank single:19197::"currently","not currently","never"} sexually active with {sexual partners:315163} She {Blank single:19197::"denies","endorses"} concerns today about STI  She {Blank single:19197::"endorses","denies"} concerns about skin changes today: {SKIN/BREAST ROS:22996} She {Blank single:19197::"endorses","denies"} concerns about bowel changes today: {SYMPTOMS; BOWEL CF:18487} She {Blank single:19197::"endorses","denies"} concerns about bladder changes today: {Symptoms; bladder:30819}  Depression Screen done today and results listed below:  Depression screen [employer] 2/9 07/08/2021 03/16/2020 03/16/2020 09/27/2012  Decreased Interest 0 0 0 0  Down, Depressed, Hopeless 0 1 1 0  PHQ - 2 Score 0 1 1 0  Altered sleeping 1 2 - -  Tired, decreased energy 1 1 - -  Change in appetite 0 0 - -  Feeling bad or failure about yourself  0 0 - -  Trouble concentrating 0 0 - -  Moving slowly or fidgety/restless 0 0 - -  Suicidal thoughts 0 0 - -  PHQ-9 Score 2 4 - -  Difficult doing work/chores Not difficult at all Somewhat difficult - -   Anxiety  Screening {Rla done/not done:30009} GAD 7 : Generalized Anxiety Score 07/08/2021 03/16/2020  Nervous, Anxious, on Edge 1 1   Control/stop worrying 0 1  Worry too much - different things 1 0  Trouble relaxing 0 1  Restless 0 0  Easily annoyed or irritable 0 1  Afraid - awful might happen 0 2  Total GAD 7 Score 2 6  Anxiety Difficulty Not difficult at all Somewhat difficult    She {has/does not have:19849} a history of falls. I {did/did not:19850} complete a risk assessment for falls. A plan of care for falls {was/was not:19852} documented. Fall Risk  07/08/2021 09/27/2012  Falls in the past year? 0 -  Number falls in past yr: 0 -  Injury with Fall? 0 -  Risk for fall due to : No Fall Risks Impaired mobility      Surgical History:  Past Surgical History:  Procedure Laterality Date   ESOPHAGOGASTRODUODENOSCOPY (EGD) WITH PROPOFOL N/A 12/03/2020   Procedure: ESOPHAGOGASTRODUODENOSCOPY (EGD) WITH PROPOFOL;  Surgeon: Mauri Pole, MD;  Location: [facility];  Service: Endoscopy;  Laterality: N/A;   MOHS SURGERY     nose    Allergies:  No Known Allergies  Social History:  Social History   Socioeconomic History   Marital status: Married    Spouse name: Not on file   Number of children: 0   Years of education: Not on file   Highest education level: Not on file  Occupational History   Not on file  Tobacco Use   Smoking status: Former    Types: Cigarettes    Quit date: 09/28/1976    Years since quitting: 44.9   Smokeless tobacco: Never  Vaping Use   Vaping Use: Never used  Substance and Sexual Activity   Alcohol use: Yes    Comment: occ   Drug use: No   Sexual activity: Not Currently  Other Topics Concern   Not on file  Social History Narrative   Not on file   Social Determinants of Health   Financial Resource Strain: Not on file  Food Insecurity: Not on file  Transportation Needs: Not on file  Physical Activity: Not on file  Stress: Not on file  Social Connections: Not on file  Intimate Partner Violence: Not on file   Social History   Tobacco Use  Smoking Status Former    Types: Cigarettes   Quit date: 09/28/1976   Years since quitting: 44.9  Smokeless Tobacco Never   Social History   Substance and Sexual Activity  Alcohol Use Yes   Comment: occ    Family History:  Family History  Problem Relation Age of Onset   Colitis Mother    Inflammatory bowel disease Mother    Coronary artery disease Mother 25       Stents   Heart attack Mother    Kidney cancer Mother    Lung cancer Father    Coronary artery disease Paternal Grandfather    Stomach cancer Paternal Grandmother    Stomach cancer Paternal Aunt    Arthritis Sister    Cancer Brother        melanoma   Diabetes Neg Hx    Hyperlipidemia Neg Hx    Hypertension Neg Hx    Sudden death Neg Hx    Esophageal cancer Neg Hx    Pancreatic cancer Neg Hx    Liver disease Neg Hx    Colon cancer Neg Hx     Past medical history, surgical history,  medications, allergies, family history and social history reviewed with patient today and changes made to appropriate areas of the chart.   All ROS negative except what is listed above and in the HPI.      Objective:    There were no vitals taken for this visit.  Wt Readings from Last 3 Encounters:  07/08/21 125 lb 12.8 oz (57.1 kg)  02/17/21 126 lb (57.2 kg)  12/03/20 125 lb (56.7 kg)    Physical Exam  Results for orders placed or performed during the hospital encounter of 12/03/20  Surgical pathology  Result Value Ref Range   SURGICAL PATHOLOGY      SURGICAL PATHOLOGY CASE: [id number] PATIENT: Sady Fossum Surgical Pathology Report     Clinical History: Dysphagia (crm)   FINAL MICROSCOPIC DIAGNOSIS:  A. ESOPHAGUS, DISTAL, BIOPSY: -  Squamocolumnar junction with chronic inflammation -  No intestinal metaplasia, dysplasia or malignancy identified  B. ESOPHAGUS, PROXIMAL, BIOPSY: -  Benign squamous mucosa -  No increased intraepithelial eosinophils   GROSS DESCRIPTION:  A: Received in formalin are tan, soft tissue fragments  that are submitted in toto. Number: 4 size: Range from 0.2 to 0.4 cm blocks: 1  B: Received in formalin are tan, soft tissue fragments that are submitted in toto. Number: 4 size: Range from 0.2 to 0.4 cm blocks: 1 Craig Staggers 12/03/2020)    Final Diagnosis performed by Thressa Sheller, MD.   Electronically signed 12/04/2020 Technical component performed at [facility], [place] [address] [place], [place] [address].  Professional component performed at [employer]. [facility], [place] [address] [place], [place] [address].  Immunohistochemistry Technical component (if applicable) was performed at [facility]. [address], [place], [place], [place] [address].   IMMUNOHISTOCHEMISTRY DISCLAIMER (if applicable): Some of these immunohistochemical stains may have been developed and the performance characteristics determine by [facility]. Some may not have been cleared or approved by the U.S. Food and Drug Administration. The FDA has determined that such clearance or approval is not necessary. This test is used for clinical purposes. It should not be regarded as investigational or for research. This laboratory is certified under the [employer] (CLIA-88) as qualified to perform high complexity clinical laboratory testing.  The controls stained appropriately.       Assessment & Plan:   Problem List Items Addressed This Visit   None    Follow up plan: Return in about 6 months (around 02/11/2022) for repeat lipids. CMP in 1 year.   LABORATORY TESTING:  - Pap smear: {Blank QQPYPP:50932::"IZT done","not applicable","up to date","done elsewhere"} - STI testing: {Blank IWPYKD:98338::"SNKNLZJQ","BHAL today","not applicable","up to date","done elsewhere"}  IMMUNIZATIONS:   - Tdap: Tetanus vaccination status reviewed: {tetanus status:315746}. - Influenza: {Blank single:19197::"Up to date","Administered  today","Postponed to flu season","Refused","Given elsewhere"} - Pneumovax: {Blank single:19197::"Up to date","Administered today","Not applicable","Refused","Given elsewhere"} - Prevnar: {Blank single:19197::"Up to date","Administered today","Not applicable","Refused","Given elsewhere"} - HPV: {Blank single:19197::"Up to date","Administered today","Not applicable","Refused","Given elsewhere"} - Zostavax vaccine: {Blank single:19197::"Up to date","Administered today","Not applicable","Refused","Given elsewhere"}  SCREENING: -Mammogram: {Blank single:19197::"Up to date","Ordered today","Not applicable","Refused","Done elsewhere"}  - Colonoscopy: {Blank single:19197::"Up to date","Ordered today","Not applicable","Refused","Done elsewhere"}  - Bone Density: {Blank single:19197::"Up to date","Ordered today","Not applicable","Refused","Done elsewhere"}  -Hearing Test: {Blank single:19197::"Up to date","Ordered today","Not applicable","Refused","Done elsewhere"}  -Spirometry: {Blank single:19197::"Up to date","Ordered today","Not applicable","Refused","Done elsewhere"}   PATIENT COUNSELING:   For all adult patients, I recommend   A well balanced diet low in saturated fats, cholesterol, and moderation in carbohydrates.  This can be as simple as monitoring portion sizes and cutting back on sugary beverages such as soda and  juice to start with.    Daily water consumption of at least 64 ounces.  Physical activity at least 180 minutes per week, if just starting out.   This can be as simple as taking the stairs instead of the elevator and walking 2-3 laps around the office  purposefully every day.   STD protection, partner selection, and regular testing if high risk.  Limited consumption of alcoholic beverages if alcohol is consumed.  For women, I recommend no more than 7 alcoholic beverages per week, spread out throughout the week.  Avoid "binge" drinking or consuming large quantities of alcohol in  one setting.   Please let me know if you feel you may need help with reduction or quitting alcohol consumption.   Avoidance of nicotine, if used.  Please let me know if you feel you may need help with reduction or quitting nicotine use.   Daily mental health attention.  This can be in the form of 5 minute daily meditation, prayer, journaling, yoga, reflection, etc.   Purposeful attention to your emotions and mental state can significantly improve your overall wellbeing  and  Health.  Please know that I am here to help you with all of your health care goals and am happy to work with you to find a solution that works best for you.  The greatest advice I have received with any changes in life are to take it one step at a time, that even means if all you can focus on is the next 60 seconds, then do that and celebrate your victories.  With any changes in life, you will have set backs, and that is OK. The important thing to remember is, if you have a set back, it is not a failure, it is an opportunity to try again!  Health Maintenance Recommendations Screening Testing Mammogram Every 1 -2 years based on history and risk factors Starting at age 77 Pap Smear Ages 21-39 every 3 years Ages 54-65 every 5 years with HPV testing More frequent testing may be required based on results and history Colon Cancer Screening Every 1-10 years based on test performed, risk factors, and history Starting at age 37 Bone Density Screening Every 2-10 years based on history Starting at age 52 for women Recommendations for men differ based on medication usage, history, and risk factors AAA Screening One time ultrasound Men 59-25 years old who have every smoked Lung Cancer Screening Low Dose Lung CT every 12 months Age 61-80 years with a 30 pack-year smoking history who still smoke or who have quit within the last 15 years  Screening Labs Routine  Labs: Complete Blood Count (CBC), Complete Metabolic Panel  (CMP), Cholesterol (Lipid Panel) Every 6-12 months based on history and medications May be recommended more frequently based on current conditions or previous results Hemoglobin A1c Lab Every 3-12 months based on history and previous results Starting at age 56 or earlier with diagnosis of diabetes, high cholesterol, BMI >26, and/or risk factors Frequent monitoring for patients with diabetes to ensure blood sugar control Thyroid Panel (TSH w/ T3 & T4) Every 6 months based on history, symptoms, and risk factors May be repeated more often if on medication HIV One time testing for all patients 9 and older May be repeated more frequently for patients with increased risk factors or exposure Hepatitis C One time testing for all patients 25  and older May be repeated more frequently for patients with increased risk factors or exposure Gonorrhea, Chlamydia Every 12 months for all sexually active persons 13-24 years Additional monitoring may be recommended for those who are considered high risk or who have symptoms PSA Men 50-33 years old with risk factors Additional screening may be recommended from age 54-69 based on risk factors, symptoms, and history  Vaccine Recommendations Tetanus Booster All adults every 10 years Flu Vaccine All patients 6 months and older every year COVID Vaccine All patients 12 years and older Initial dosing with booster May recommend additional booster based on age and health history HPV Vaccine 2 doses all patients age 55-26 Dosing may be considered for patients over 26 Shingles Vaccine (Shingrix) 2 doses all adults 44 years and older Pneumonia (Pneumovax 23) All adults 30 years and older May recommend earlier dosing based on health history Pneumonia (Prevnar 78) All adults 2 years and older Dosed 1 year after Pneumovax 23  Additional Screening, Testing, and Vaccinations may be recommended on an individualized basis based on family history, health  history, risk factors, and/or exposure.      NEXT PREVENTATIVE PHYSICAL DUE IN 1 YEAR. Return in about 6 months (around 02/11/2022) for repeat lipids. CMP in 1 year.

## 2021-08-14 NOTE — Assessment & Plan Note (Signed)
Hx of deficiency.  Tingling in great toe of right foot.  Will recheck levels today

## 2021-08-14 NOTE — Assessment & Plan Note (Signed)
Historical finding no statin therapy recommended by previous provider.  Will obtain labs today for re-evaluation and make recommendations based on findings.

## 2021-08-14 NOTE — Assessment & Plan Note (Signed)
Historical Will recheck levels today

## 2021-08-14 NOTE — Patient Instructions (Addendum)
Recommendations: We will get your labs today for evaluation and be in touch with you with the results. If there are any abnormalities we will contact you to discuss a follow-up appointment or virtual visit to go over the results and determine a plan of care.  Please allow [place] at least 3-5 business days from the time the results have arrived in your MyChart basket to send a response to these.  If after that time you do not see results please feel free to contact the office.  Continue with physical therapy for weakness.  I recommend that you take the Protonix (pantoprazole) daily to help with the cough and reflux symptoms.  This medication needs to be taken on a daily basis to work effectively.  We will review her lipid your labs to see if there is any reason for the numbness and tingling in the toe.  If not we can always send a referral to neurology for study of the nerves to ensure that there are no concerning findings present.

## 2021-08-14 NOTE — Telephone Encounter (Signed)
Cytology office called stated they need a order put in for pt to have pap. Pt is there. Number to call back is [phone] Please advise.

## 2021-08-14 NOTE — Assessment & Plan Note (Signed)
CPE and labs today Pap today Plans to get mammogram in near future.  Shingles vaccine Rx provided for patient.  UTD on other HM activities.  Will make changes to plan of care based on labs.

## 2021-08-14 NOTE — Assessment & Plan Note (Signed)
Improving with PT.  No changes or concerning findings present at this time.  Continue PT and f/u if no improvement or new or changing symptoms present.

## 2021-08-14 NOTE — Progress Notes (Signed)
BP 117/78   Pulse 95   Ht 5\' 6"  (1.676 m)   Wt 127 lb 3.2 oz (57.7 kg)   SpO2 97%   BMI 20.53 kg/m    Subjective:    Patient ID: Jaclyn Spencer, female    DOB: 02/06/1956, 65 y.o.   MRN: [id number]  HPI: Jaclyn Spencer is a 65 y.o. female presenting on 08/14/2021 for comprehensive medical examination.   Current medical concerns include: reflux symptoms. Previous use of pantoprazole was helpful. Would like to restart short term.  Numbness in right big toe on plantar side present for some time now.  She reports it is constant. No hx of DM or back issues. No balance or gait issues. No injury known.   Past Medical History:  Past Medical History:  Diagnosis Date   Abdominal pain    Abdominal pain, left upper quadrant 02/27/2010   Qualifier: Diagnosis of  By: Olevia Perches MD, Lowella Bandy    Abdominal pain, right lower quadrant 05/29/2010   Qualifier: Diagnosis of  By: Birdie Riddle MD, Katherine     Allergy    rhinitis   Anxiety    B12 deficiency    Basal cell carcinoma    CHEST PAIN 05/29/2010   Qualifier: Diagnosis of  By: Birdie Riddle MD, Belenda Cruise     Diverticulosis    Epigastric pain 08/27/2011   GERD (gastroesophageal reflux disease)    Shoulder pain 08/27/2011   Tick bite 03/08/2012   Vitamin D deficiency    Weight loss    Medications:  Current Outpatient Medications on File Prior to Visit  Medication Sig   azelastine (ASTELIN) 0.1 % nasal spray Place 1 spray into both nostrils daily as needed for allergies or rhinitis.   dicyclomine (BENTYL) 10 MG capsule Take 1 capsule (10 mg total) by mouth every 8 (eight) hours as needed for spasms.   hydrocortisone (ANUSOL-HC) 2.5 % rectal cream Place 1 application rectally at bedtime as needed for hemorrhoids or anal itching.   loratadine (CLARITIN) 10 MG tablet Take 10 mg by mouth daily as needed for allergies.   Multiple Vitamins-Minerals (MULTIVITAMIN WITH MINERALS) tablet Take 1 tablet by mouth 3 (three) times a week.   No current facility-administered  medications on file prior to visit.   Interim Problems from her last visit: yes - see HPI above  She reports regular vision exams q1-5y: yes She reports regular dental exams q 30m: yes Her diet consists of:  overall heathy She endorses exercise and/or activity of:  daily activity  She denies ETOH use  She denies nictoine use  She denies illegal substance use  She is postmenopausal She reports no menstrual bleeding.  Current menopausal symptoms: vaginal dryness.  She is not currently sexually active  She denies concerns today about STI  She endorses concerns about skin changes today: changed mole located on her back near midline in the upper thoracic region. She reports it has started to feel rough similar to a skin cancer she had removed a few years ago on her face.  She denies concerns about bowel changes today:  She denies concerns about bladder changes today:   Depression Screen done today and results listed below:  Depression screen [facility] 2/9 07/08/2021 03/16/2020 03/16/2020 09/27/2012  Decreased Interest 0 0 0 0  Down, Depressed, Hopeless 0 1 1 0  PHQ - 2 Score 0 1 1 0  Altered sleeping 1 2 - -  Tired, decreased energy 1 1 - -  Change in appetite 0 0 - -  Feeling bad or failure about yourself  0 0 - -  Trouble concentrating 0 0 - -  Moving slowly or fidgety/restless 0 0 - -  Suicidal thoughts 0 0 - -  PHQ-9 Score 2 4 - -  Difficult doing work/chores Not difficult at all Somewhat difficult - -   Anxiety Screening done GAD 7 : Generalized Anxiety Score 07/08/2021 03/16/2020  Nervous, Anxious, on Edge 1 1  Control/stop worrying 0 1  Worry too much - different things 1 0  Trouble relaxing 0 1  Restless 0 0  Easily annoyed or irritable 0 1  Afraid - awful might happen 0 2  Total GAD 7 Score 2 6  Anxiety Difficulty Not difficult at all Somewhat difficult    She does not have a history of falls. I did complete a risk assessment for falls. A plan of care for falls was  documented. Fall Risk  07/08/2021 09/27/2012  Falls in the past year? 0 -  Number falls in past yr: 0 -  Injury with Fall? 0 -  Risk for fall due to : No Fall Risks Impaired mobility      Surgical History:  Past Surgical History:  Procedure Laterality Date   ESOPHAGOGASTRODUODENOSCOPY (EGD) WITH PROPOFOL N/A 12/03/2020   Procedure: ESOPHAGOGASTRODUODENOSCOPY (EGD) WITH PROPOFOL;  Surgeon: Mauri Pole, MD;  Location: [facility];  Service: Endoscopy;  Laterality: N/A;   MOHS SURGERY     nose    Allergies:  No Known Allergies  Social History:  Social History   Socioeconomic History   Marital status: Married    Spouse name: Not on file   Number of children: 0   Years of education: Not on file   Highest education level: Not on file  Occupational History   Not on file  Tobacco Use   Smoking status: Former    Types: Cigarettes    Quit date: 09/28/1976    Years since quitting: 44.9   Smokeless tobacco: Never  Vaping Use   Vaping Use: Never used  Substance and Sexual Activity   Alcohol use: Yes    Comment: occ   Drug use: No   Sexual activity: Not Currently  Other Topics Concern   Not on file  Social History Narrative   Not on file   Social Determinants of Health   Financial Resource Strain: Not on file  Food Insecurity: Not on file  Transportation Needs: Not on file  Physical Activity: Not on file  Stress: Not on file  Social Connections: Not on file  Intimate Partner Violence: Not on file   Social History   Tobacco Use  Smoking Status Former   Types: Cigarettes   Quit date: 09/28/1976   Years since quitting: 44.9  Smokeless Tobacco Never   Social History   Substance and Sexual Activity  Alcohol Use Yes   Comment: occ    Family History:  Family History  Problem Relation Age of Onset   Colitis Mother    Inflammatory bowel disease Mother    Coronary artery disease Mother 41       Stents   Heart attack Mother    Kidney cancer Mother     Lung cancer Father    Coronary artery disease Paternal Grandfather    Stomach cancer Paternal Grandmother    Stomach cancer Paternal Aunt    Arthritis Sister    Cancer Brother        melanoma   Diabetes Neg Hx    Hyperlipidemia  Neg Hx    Hypertension Neg Hx    Sudden death Neg Hx    Esophageal cancer Neg Hx    Pancreatic cancer Neg Hx    Liver disease Neg Hx    Colon cancer Neg Hx     Past medical history, surgical history, medications, allergies, family history and social history reviewed with patient today and changes made to appropriate areas of the chart.   All ROS negative except what is listed above and in the HPI.      Objective:    BP 117/78   Pulse 95   Ht 5\' 6"  (1.676 m)   Wt 127 lb 3.2 oz (57.7 kg)   SpO2 97%   BMI 20.53 kg/m   Wt Readings from Last 3 Encounters:  08/14/21 127 lb 3.2 oz (57.7 kg)  07/08/21 125 lb 12.8 oz (57.1 kg)  02/17/21 126 lb (57.2 kg)    Physical Exam Vitals and nursing note reviewed. Exam conducted with a chaperone present.  Constitutional:      General: She is not in acute distress.    Appearance: Normal appearance.  HENT:     Head: Normocephalic and atraumatic.     Right Ear: Hearing, tympanic membrane, ear canal and external ear normal.     Left Ear: Hearing, tympanic membrane, ear canal and external ear normal.     Nose: Nose normal.     Right Sinus: No maxillary sinus tenderness or frontal sinus tenderness.     Left Sinus: No maxillary sinus tenderness or frontal sinus tenderness.     Mouth/Throat:     Lips: Pink.     Mouth: Mucous membranes are moist.     Pharynx: Oropharynx is clear.  Eyes:     General: Lids are normal. Vision grossly intact.     Extraocular Movements: Extraocular movements intact.     Conjunctiva/sclera: Conjunctivae normal.     Pupils: Pupils are equal, round, and reactive to light.     Funduscopic exam:    Right eye: No hemorrhage. Red reflex present.        Left eye: No hemorrhage. Red reflex  present.    Visual Fields: Right eye visual fields normal and left eye visual fields normal.  Neck:     Thyroid: No thyromegaly.     Vascular: No carotid bruit.  Cardiovascular:     Rate and Rhythm: Normal rate and regular rhythm.     Chest Wall: PMI is not displaced.     Pulses: Normal pulses.          Dorsalis pedis pulses are 2+ on the right side and 2+ on the left side.       Posterior tibial pulses are 2+ on the right side and 2+ on the left side.     Heart sounds: Normal heart sounds. No murmur heard. Pulmonary:     Effort: Pulmonary effort is normal. No respiratory distress.     Breath sounds: Normal breath sounds.  Chest:     Chest wall: No mass, deformity or tenderness.  Breasts:    Breasts are symmetrical.     Right: Normal.     Left: Normal.  Abdominal:     General: Abdomen is flat. Bowel sounds are normal. There is no distension or abdominal bruit.     Palpations: Abdomen is soft. There is no hepatomegaly, splenomegaly or mass.     Tenderness: There is no abdominal tenderness. There is no right CVA tenderness, left CVA tenderness, guarding  or rebound.     Hernia: No hernia is present. There is no hernia in the left inguinal area or right inguinal area.  Genitourinary:    General: Normal vulva.     Exam position: Lithotomy position.     Pubic Area: No rash.      Tanner stage (genital): 5.     Labia:        Right: No rash or tenderness.        Left: No rash.      Urethra: Prolapse present. No urethral swelling.     Vagina: Vaginal discharge present.     Cervix: Friability present. No cervical motion tenderness.     Uterus: Normal.      Adnexa: Right adnexa normal and left adnexa normal.     Rectum: Normal.  Musculoskeletal:        General: Normal range of motion.     Cervical back: Full passive range of motion without pain, normal range of motion and neck supple. No tenderness.     Right lower leg: No edema.     Left lower leg: No edema.  Feet:     Right foot:      Skin integrity: Skin integrity normal.     Toenail Condition: Right toenails are normal.     Left foot:     Skin integrity: Skin integrity normal.     Toenail Condition: Left toenails are normal.  Lymphadenopathy:     Cervical: No cervical adenopathy.     Upper Body:     Right upper body: No supraclavicular, axillary or pectoral adenopathy.     Left upper body: No supraclavicular, axillary or pectoral adenopathy.     Lower Body: No right inguinal adenopathy.  Skin:    General: Skin is warm and dry.     Capillary Refill: Capillary refill takes less than 2 seconds.     Nails: There is no clubbing.  Neurological:     General: No focal deficit present.     Mental Status: She is alert and oriented to person, place, and time.     GCS: GCS eye subscore is 4. GCS verbal subscore is 5. GCS motor subscore is 6.     Cranial Nerves: No cranial nerve deficit.     Sensory: Sensation is intact. No sensory deficit.     Motor: Motor function is intact. No weakness.     Coordination: Coordination is intact. Coordination normal.     Gait: Gait is intact.     Deep Tendon Reflexes: Reflexes are normal and symmetric.  Psychiatric:        Attention and Perception: Attention normal.        Mood and Affect: Mood normal.        Speech: Speech normal.        Behavior: Behavior normal. Behavior is cooperative.        Thought Content: Thought content normal.        Cognition and Memory: Cognition and memory normal.        Judgment: Judgment normal.    Results for orders placed or performed during the hospital encounter of 12/03/20  Surgical pathology  Result Value Ref Range   SURGICAL PATHOLOGY      SURGICAL PATHOLOGY CASE: [id number] PATIENT: Falicity Kolodziej Surgical Pathology Report     Clinical History: Dysphagia (crm)   FINAL MICROSCOPIC DIAGNOSIS:  A. ESOPHAGUS, DISTAL, BIOPSY: -  Squamocolumnar junction with chronic inflammation -  No intestinal metaplasia, dysplasia or malignancy  identified  B. ESOPHAGUS, PROXIMAL, BIOPSY: -  Benign squamous mucosa -  No increased intraepithelial eosinophils   GROSS DESCRIPTION:  A: Received in formalin are tan, soft tissue fragments that are submitted in toto. Number: 4 size: Range from 0.2 to 0.4 cm blocks: 1  B: Received in formalin are tan, soft tissue fragments that are submitted in toto. Number: 4 size: Range from 0.2 to 0.4 cm blocks: 1 Craig Staggers 12/03/2020)    Final Diagnosis performed by Thressa Sheller, MD.   Electronically signed 12/04/2020 Technical component performed at [facility], [place] [address], [place], [place] [address].  Professional component performed at [employer]. [facility], [place] [address] [place], [place] [address].  Immunohistochemistry Technical component (if applicable) was performed at [facility]. [address] [place], [place], [place] [address].   IMMUNOHISTOCHEMISTRY DISCLAIMER (if applicable): Some of these immunohistochemical stains may have been developed and the performance characteristics determine by [facility]. Some may not have been cleared or approved by the U.S. Food and Drug Administration. The FDA has determined that such clearance or approval is not necessary. This test is used for clinical purposes. It should not be regarded as investigational or for research. This laboratory is certified under the [employer] (CLIA-88) as qualified to perform high complexity clinical laboratory testing.  The controls stained appropriately.       Assessment & Plan:   Problem List Items Addressed This Visit     B12 deficiency    Hx of deficiency.  Tingling in great toe of right foot.  Will recheck levels today      Relevant Orders   CBC with Differential/Platelet   Comprehensive metabolic panel   Lipid panel   TSH   VITAMIN D 25 Hydroxy (Vit-D Deficiency, Fractures)   Hemoglobin A1c    B12 and Folate Panel   Vitamin D deficiency, unspecified    Historical Will recheck levels today      Relevant Orders   CBC with Differential/Platelet   Comprehensive metabolic panel   Lipid panel   TSH   VITAMIN D 25 Hydroxy (Vit-D Deficiency, Fractures)   Hemoglobin A1c   B12 and Folate Panel   Numbness and tingling    Present in great toe on right foot.  Sensation intact, but "different" per patient.  Will monitor labs today and determine if deficiency is present to help find cause.  Will consider neuro referral if no cause found.       Relevant Orders   CBC with Differential/Platelet   Comprehensive metabolic panel   Lipid panel   TSH   VITAMIN D 25 Hydroxy (Vit-D Deficiency, Fractures)   Hemoglobin A1c   B12 and Folate Panel   Gastroesophageal reflux disease with esophagitis without hemorrhage    Plan to restart pantoprazole daily for at least 30 days to help with symptom management.  May stop at 30 days and restart if symptoms return.  No alarm findings present. If no improvement with medication will plan GI referral.       Relevant Medications   pantoprazole (PROTONIX) 20 MG tablet   Other Relevant Orders   CBC with Differential/Platelet   Comprehensive metabolic panel   Lipid panel   TSH   VITAMIN D 25 Hydroxy (Vit-D Deficiency, Fractures)   Hemoglobin A1c   B12 and Folate Panel   Left-sided muscle weakness    Improving with PT.  No changes or concerning findings present at this  time.  Continue PT and f/u if no improvement or new or changing symptoms present.       Relevant Orders   CBC with Differential/Platelet   Comprehensive metabolic panel   Lipid panel   TSH   VITAMIN D 25 Hydroxy (Vit-D Deficiency, Fractures)   Hemoglobin A1c   B12 and Folate Panel   Encounter for annual physical exam - Primary    CPE and labs today Pap today Plans to get mammogram in near future.  Shingles vaccine Rx provided for patient.  UTD on other HM activities.   Will make changes to plan of care based on labs.       Relevant Orders   CBC with Differential/Platelet   Comprehensive metabolic panel   Lipid panel   TSH   VITAMIN D 25 Hydroxy (Vit-D Deficiency, Fractures)   Hemoglobin A1c   B12 and Folate Panel   Hypercholesterolemia    Historical finding no statin therapy recommended by previous provider.  Will obtain labs today for re-evaluation and make recommendations based on findings.       Relevant Orders   CBC with Differential/Platelet   Comprehensive metabolic panel   Lipid panel   TSH   VITAMIN D 25 Hydroxy (Vit-D Deficiency, Fractures)   Hemoglobin A1c   B12 and Folate Panel   Other Visit Diagnoses     Screening for endocrine, metabolic and immunity disorder       Relevant Orders   CBC with Differential/Platelet   Comprehensive metabolic panel   Lipid panel   TSH   VITAMIN D 25 Hydroxy (Vit-D Deficiency, Fractures)   Hemoglobin A1c   B12 and Folate Panel   Pap smear for cervical cancer screening       Relevant Orders   Cervicovaginal ancillary only   Vaginal discharge       Relevant Orders   Cervicovaginal ancillary only        Follow up plan: Return in about 6 months (around 02/11/2022) for repeat lipids. CMP in 1 year.   LABORATORY TESTING:  - Pap smear: pap done - STI testing: deferred  IMMUNIZATIONS:   - Tdap: Tetanus vaccination status reviewed: last tetanus booster within 10 years. - Influenza: Up to date - Pneumovax: Not applicable - Prevnar: Not applicable - HPV: Not applicable - Zostavax vaccine:  Rx provided  SCREENING: -Mammogram:  in near future   - Colonoscopy: Up to date  - Bone Density: Not applicable  -Hearing Test: Not applicable  -Spirometry: Not applicable   PATIENT COUNSELING:   For all adult patients, I recommend   A well balanced diet low in saturated fats, cholesterol, and moderation in carbohydrates.   This can be as simple as monitoring portion sizes and cutting back on  sugary beverages such as soda and  juice to start with.    Daily water consumption of at least 64 ounces.  Physical activity at least 180 minutes per week, if just starting out.   This can be as simple as taking the stairs instead of the elevator and walking 2-3 laps around the office  purposefully every day.   STD protection, partner selection, and regular testing if high risk.  Limited consumption of alcoholic beverages if alcohol is consumed.  For women, I recommend no more than 7 alcoholic beverages per week, spread out throughout the week.  Avoid "binge" drinking or consuming large quantities of alcohol in one setting.   Please let me know if you feel you may need help with  reduction or quitting alcohol consumption.   Avoidance of nicotine, if used.  Please let me know if you feel you may need help with reduction or quitting nicotine use.   Daily mental health attention.  This can be in the form of 5 minute daily meditation, prayer, journaling, yoga, reflection, etc.   Purposeful attention to your emotions and mental state can significantly improve your overall wellbeing  and  Health.  Please know that I am here to help you with all of your health care goals and am happy to work with you to find a solution that works best for you.  The greatest advice I have received with any changes in life are to take it one step at a time, that even means if all you can focus on is the next 60 seconds, then do that and celebrate your victories.  With any changes in life, you will have set backs, and that is OK. The important thing to remember is, if you have a set back, it is not a failure, it is an opportunity to try again!  Health Maintenance Recommendations Screening Testing Mammogram Every 1 -2 years based on history and risk factors Starting at age 33 Pap Smear Ages 21-39 every 3 years Ages 74-65 every 5 years with HPV testing More frequent testing may be required based on results and  history Colon Cancer Screening Every 1-10 years based on test performed, risk factors, and history Starting at age 73 Bone Density Screening Every 2-10 years based on history Starting at age 81 for women Recommendations for men differ based on medication usage, history, and risk factors AAA Screening One time ultrasound Men 52-48 years old who have every smoked Lung Cancer Screening Low Dose Lung CT every 12 months Age 59-80 years with a 30 pack-year smoking history who still smoke or who have quit within the last 15 years  Screening Labs Routine  Labs: Complete Blood Count (CBC), Complete Metabolic Panel (CMP), Cholesterol (Lipid Panel) Every 6-12 months based on history and medications May be recommended more frequently based on current conditions or previous results Hemoglobin A1c Lab Every 3-12 months based on history and previous results Starting at age 60 or earlier with diagnosis of diabetes, high cholesterol, BMI >26, and/or risk factors Frequent monitoring for patients with diabetes to ensure blood sugar control Thyroid Panel (TSH w/ T3 & T4) Every 6 months based on history, symptoms, and risk factors May be repeated more often if on medication HIV One time testing for all patients 71 and older May be repeated more frequently for patients with increased risk factors or exposure Hepatitis C One time testing for all patients 39 and older May be repeated more frequently for patients with increased risk factors or exposure Gonorrhea, Chlamydia Every 12 months for all sexually active persons 13-24 years Additional monitoring may be recommended for those who are considered high risk or who have symptoms PSA Men 17-8 years old with risk factors Additional screening may be recommended from age 36-69 based on risk factors, symptoms, and history  Vaccine Recommendations Tetanus Booster All adults every 10 years Flu Vaccine All patients 6 months and older every year COVID  Vaccine All patients 12 years and older Initial dosing with booster May recommend additional booster based on age and health history HPV Vaccine 2 doses all patients age 52-26 Dosing may be considered for patients over 26 Shingles Vaccine (Shingrix) 2 doses all adults 65 years and older Pneumonia (Pneumovax 8) All adults 32  years and older May recommend earlier dosing based on health history Pneumonia (Prevnar 13) All adults 49 years and older Dosed 1 year after Pneumovax 23  Additional Screening, Testing, and Vaccinations may be recommended on an individualized basis based on family history, health history, risk factors, and/or exposure.      NEXT PREVENTATIVE PHYSICAL DUE IN 1 YEAR. Return in about 6 months (around 02/11/2022) for repeat lipids. CMP in 1 year.

## 2021-08-14 NOTE — Assessment & Plan Note (Signed)
Present in great toe on right foot.  Sensation intact, but "different" per patient.  Will monitor labs today and determine if deficiency is present to help find cause.  Will consider neuro referral if no cause found.

## 2021-08-14 NOTE — Assessment & Plan Note (Signed)
Plan to restart pantoprazole daily for at least 30 days to help with symptom management.  May stop at 30 days and restart if symptoms return.  No alarm findings present. If no improvement with medication will plan GI referral.

## 2021-08-15 LAB — COMPREHENSIVE METABOLIC PANEL
ALT: 15 IU/L (ref 0–32)
AST: 17 IU/L (ref 0–40)
Albumin/Globulin Ratio: 2.3 — ABNORMAL HIGH (ref 1.2–2.2)
Albumin: 4.9 g/dL — ABNORMAL HIGH (ref 3.8–4.8)
Alkaline Phosphatase: 64 IU/L (ref 44–121)
BUN/Creatinine Ratio: 17 (ref 12–28)
BUN: 10 mg/dL (ref 8–27)
Bilirubin Total: 0.5 mg/dL (ref 0.0–1.2)
CO2: 22 mmol/L (ref 20–29)
Calcium: 9.4 mg/dL (ref 8.7–10.3)
Chloride: 101 mmol/L (ref 96–106)
Creatinine, Ser: 0.59 mg/dL (ref 0.57–1.00)
Globulin, Total: 2.1 g/dL (ref 1.5–4.5)
Glucose: 93 mg/dL (ref 70–99)
Potassium: 4.7 mmol/L (ref 3.5–5.2)
Sodium: 140 mmol/L (ref 134–144)
Total Protein: 7 g/dL (ref 6.0–8.5)
eGFR: 101 mL/min/{1.73_m2} (ref >59)

## 2021-08-15 LAB — CBC WITH DIFFERENTIAL/PLATELET
Basophils Absolute: 0 10*3/uL (ref 0.0–0.2)
Basos: 1 %
EOS (ABSOLUTE): 0.1 10*3/uL (ref 0.0–0.4)
Eos: 2 %
Hematocrit: 42.4 % (ref 34.0–46.6)
Hemoglobin: 14.2 g/dL (ref 11.1–15.9)
Immature Grans (Abs): 0 10*3/uL (ref 0.0–0.1)
Immature Granulocytes: 0 %
Lymphocytes Absolute: 1.9 10*3/uL (ref 0.7–3.1)
Lymphs: 23 %
MCH: 31.8 pg (ref 26.6–33.0)
MCHC: 33.5 g/dL (ref 31.5–35.7)
MCV: 95 fL (ref 79–97)
Monocytes Absolute: 0.6 10*3/uL (ref 0.1–0.9)
Monocytes: 8 %
Neutrophils Absolute: 5.5 10*3/uL (ref 1.4–7.0)
Neutrophils: 66 %
Platelets: 241 10*3/uL (ref 150–450)
RBC: 4.47 x10E6/uL (ref 3.77–5.28)
RDW: 13.1 % (ref 11.7–15.4)
WBC: 8.2 10*3/uL (ref 3.4–10.8)

## 2021-08-15 LAB — HEMOGLOBIN A1C
Est. average glucose Bld gHb Est-mCnc: 108 mg/dL
Hgb A1c MFr Bld: 5.4 % (ref 4.8–5.6)

## 2021-08-15 LAB — CERVICOVAGINAL ANCILLARY ONLY
Bacterial Vaginitis (gardnerella): NEGATIVE
Candida Glabrata: NEGATIVE
Candida Vaginitis: NEGATIVE
Comment: NEGATIVE
Comment: NEGATIVE
Comment: NEGATIVE
Comment: NEGATIVE
Trichomonas: NEGATIVE

## 2021-08-15 LAB — LIPID PANEL
Chol/HDL Ratio: 2.6 ratio (ref 0.0–4.4)
Cholesterol, Total: 230 mg/dL — ABNORMAL HIGH (ref 100–199)
HDL: 88 mg/dL (ref >39)
LDL Chol Calc (NIH): 132 mg/dL — ABNORMAL HIGH (ref 0–99)
Triglycerides: 57 mg/dL (ref 0–149)
VLDL Cholesterol Cal: 10 mg/dL (ref 5–40)

## 2021-08-15 LAB — VITAMIN D 25 HYDROXY (VIT D DEFICIENCY, FRACTURES): Vit D, 25-Hydroxy: 31.6 ng/mL (ref 30.0–100.0)

## 2021-08-15 LAB — TSH: TSH: 1.56 u[IU]/mL (ref 0.450–4.500)

## 2021-08-15 NOTE — Telephone Encounter (Signed)
Spoke with cytology lab Separate pap order paced

## 2021-08-26 LAB — CYTOLOGY - PAP
Comment: NEGATIVE
Diagnosis: NEGATIVE
High risk HPV: NEGATIVE

## 2021-10-06 IMAGING — RF DG ESOPHAGUS
9 of 13 series · 12 of 19 positions shown · non-contrast
Comparison: None.

CLINICAL DATA: Dysphagia, particularly with pills. Hoarseness.
Recent attempted endoscopy was unsuccessful.

EXAM:
ESOPHOGRAM / BARIUM SWALLOW / BARIUM TABLET STUDY
TECHNIQUE: Combined double contrast and single contrast examination performed
using effervescent crystals, thick barium liquid, and thin barium
liquid. The patient was observed with fluoroscopy swallowing a 13 mm
barium sulphate tablet.
FLUOROSCOPY TIME:  Fluoroscopy Time:  3 minutes 12 seconds
Radiation Exposure Index (if provided by the fluoroscopic device):
8.8 mGy
Number of Acquired Spot Images: 0

[Series 1: cp_standard · 0.37mm/px · 3 of 40 frames shown (1 of 9)]
[frame 7/40]
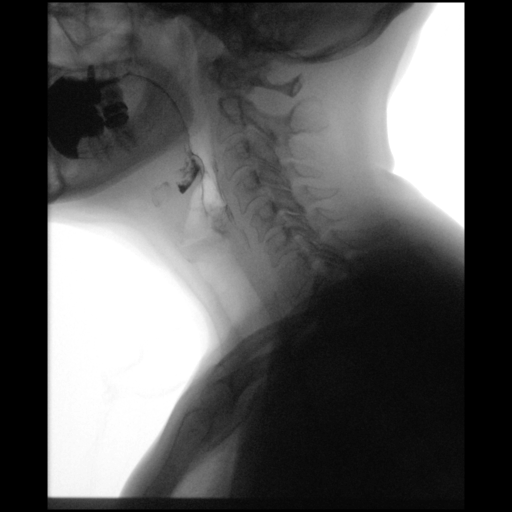
[frame 30/40]
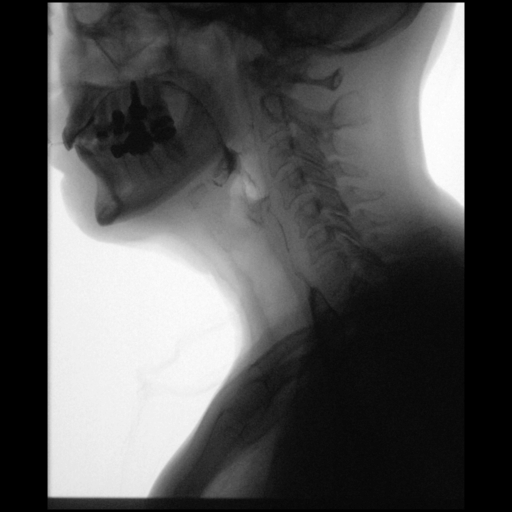
[frame 35/40]
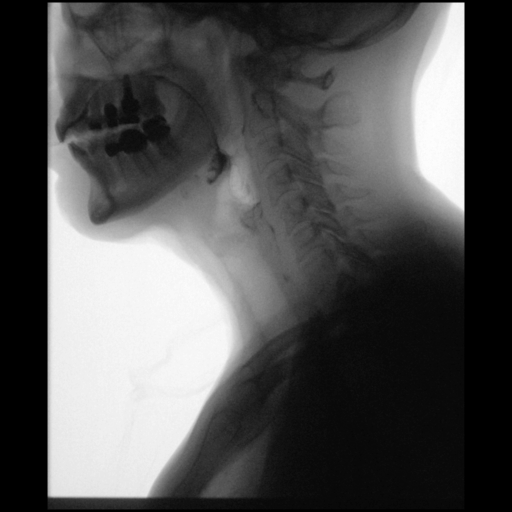

[Series 2: cp_standard · 0.37mm/px · 2 of 59 frames shown (2 of 9)]
[frame 30/59]
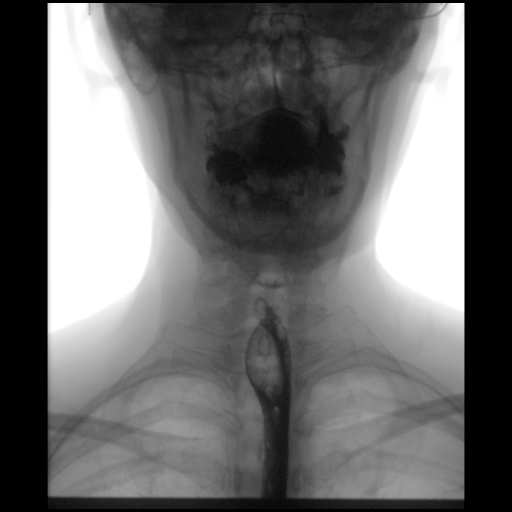
[frame 52/59]
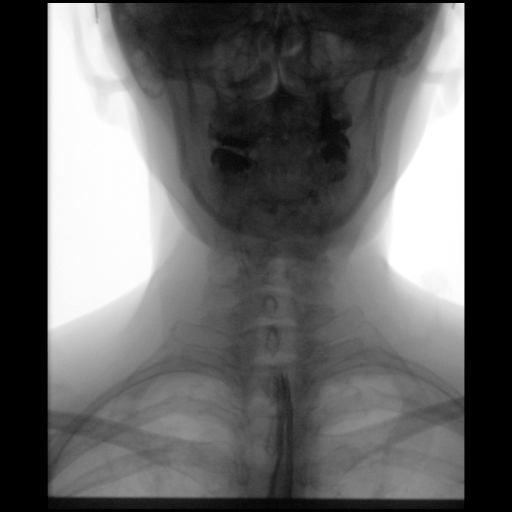

[Series 3: cp_standard · 0.27mm/px · 1 of 1 slices shown (3 of 9)]
[im 1/1]
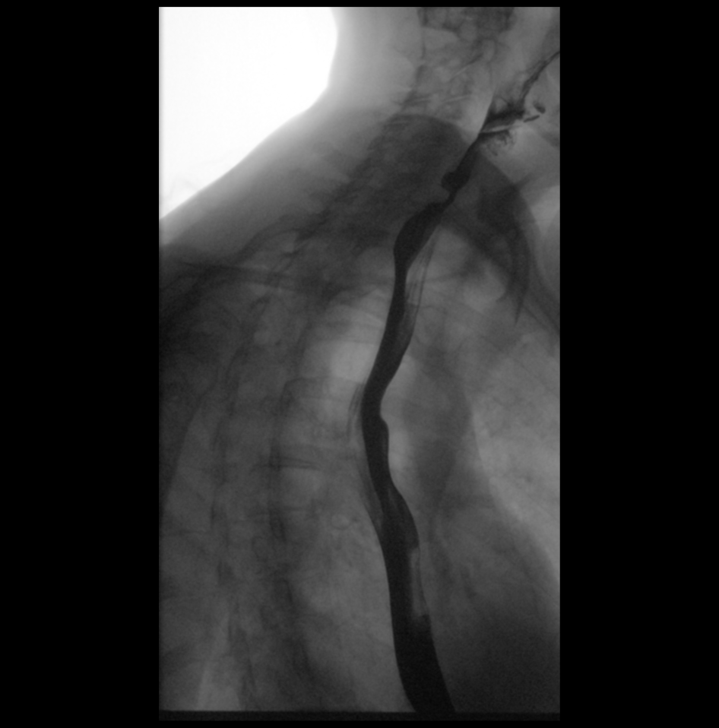

[Series 5: cp_standard · 0.27mm/px · 1 of 1 slices shown (4 of 9)]
[im 1/1]
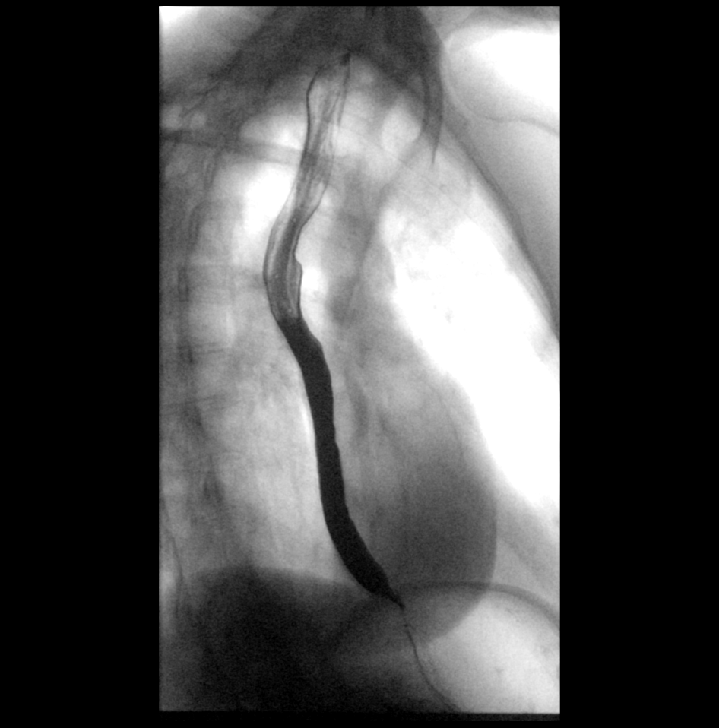

[Series 6: cp_standard · 0.27mm/px · 1 of 1 slices shown (5 of 9)]
[im 1/1]
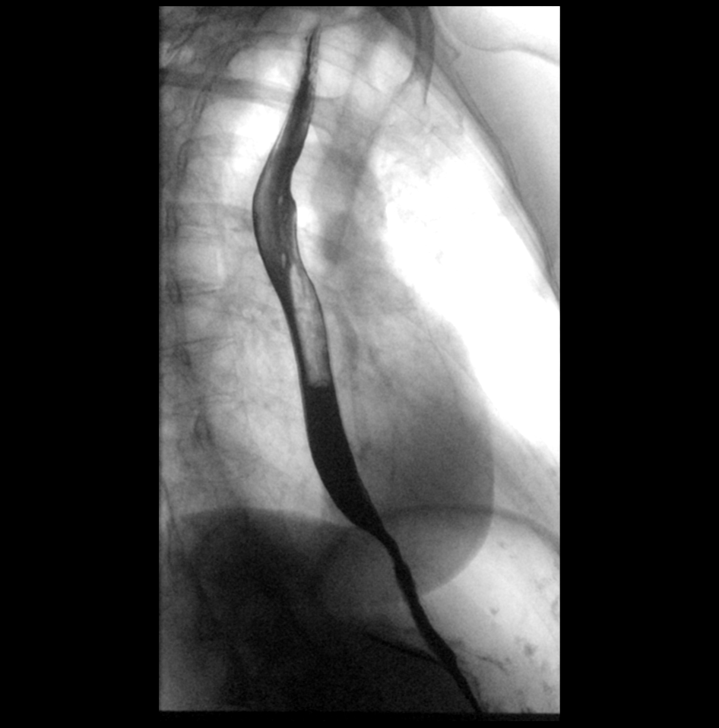

[Series 8: cp_standard · 0.27mm/px · 1 of 1 slices shown (6 of 9)]
[im 1/1]
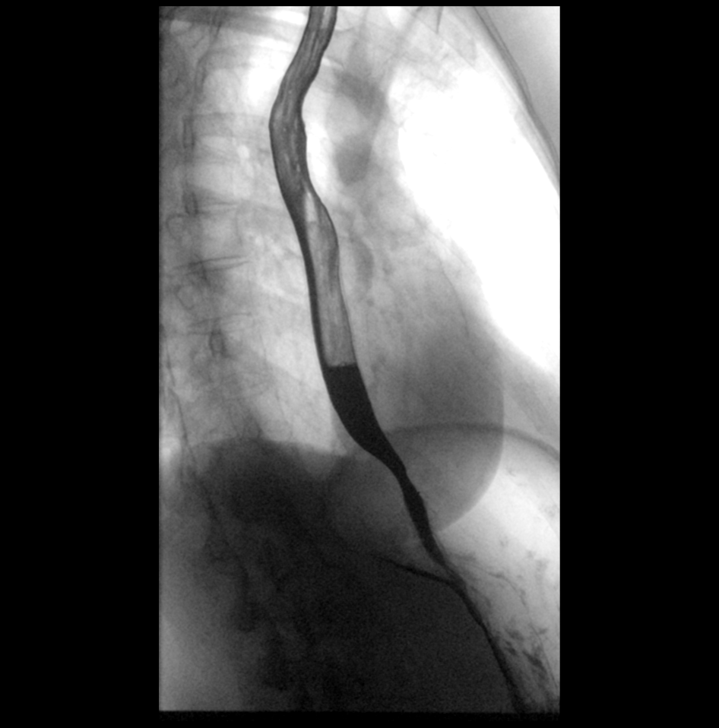

[Series 10: cp_standard · 0.30mm/px · 1 of 1 slices shown (7 of 9)]
[im 1/1]
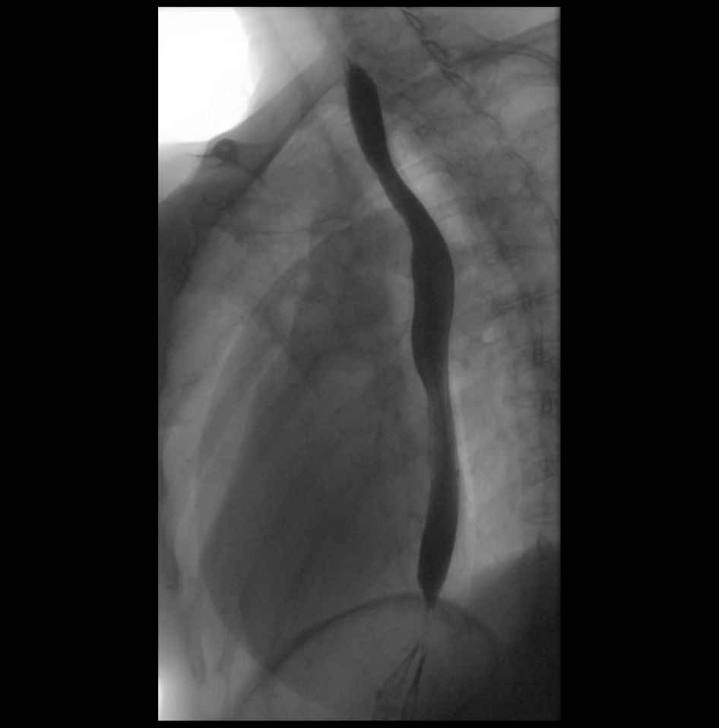

[Series 11: cp_standard · 0.30mm/px · 1 of 1 slices shown (8 of 9)]
[im 1/1]
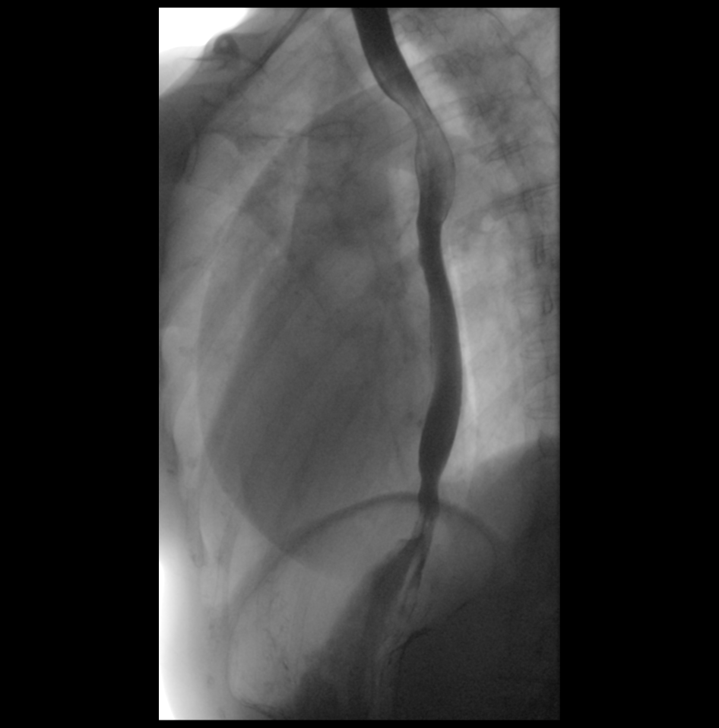

[Series 13: cp_standard · 0.27mm/px · 1 of 1 slices shown (9 of 9)]
[im 1/1]
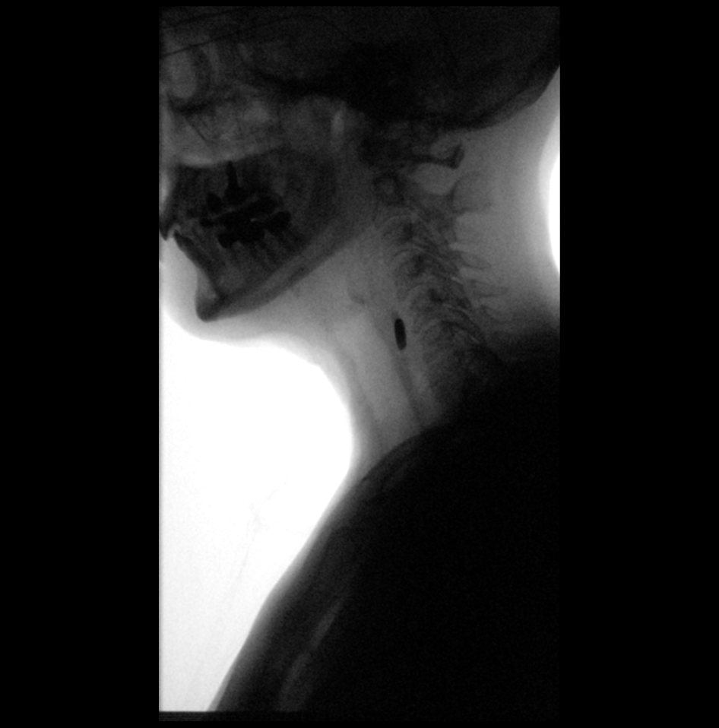

[12 of 19 positions shown; findings below may reference images not displayed]

FINDINGS: No vestibular penetration or aspiration is seen during swallowing. A
prominent cricopharyngeal impression is seen on the posterior
cervical esophagus during swallowing. An ingested 13 mm barium
tablet became stuck at this level, and only passed with subsequent
large swallows of water. There is no evidence of [place]
diverticulum.

The thoracic esophagus is normal in appearance. Esophageal motility
is within normal limits. There is no evidence of hiatal hernia, and
no gastroesophageal reflux was seen during the exam.
IMPRESSION: Cricopharyngeal achalasia.

No evidence of [place] diverticulum or other significant
abnormality.

## 2021-11-06 ENCOUNTER — Other Ambulatory Visit: Payer: Self-pay

## 2021-11-06 ENCOUNTER — Telehealth (HOSPITAL_BASED_OUTPATIENT_CLINIC_OR_DEPARTMENT_OTHER): Payer: Self-pay | Admitting: Nurse Practitioner

## 2021-11-10 ENCOUNTER — Ambulatory Visit (INDEPENDENT_AMBULATORY_CARE_PROVIDER_SITE_OTHER): Payer: Medicare Other | Admitting: Nurse Practitioner

## 2021-11-10 ENCOUNTER — Encounter (HOSPITAL_BASED_OUTPATIENT_CLINIC_OR_DEPARTMENT_OTHER): Payer: Self-pay | Admitting: Nurse Practitioner

## 2021-11-10 ENCOUNTER — Other Ambulatory Visit: Payer: Self-pay

## 2021-11-10 DIAGNOSIS — J302 Other seasonal allergic rhinitis: Secondary | ICD-10-CM | POA: Diagnosis not present

## 2021-11-10 MED ORDER — AZELASTINE HCL 0.1 % NA SOLN: 2.0000 | Freq: Two times a day (BID) | NASAL | 6 refills | Status: DC

## 2021-11-10 MED ORDER — LORATADINE 10 MG PO TABS
10.0000 mg | ORAL_TABLET | Freq: Every day | ORAL | 11 refills | Status: AC
Start: 2021-11-10 — End: ?

## 2021-11-10 NOTE — Progress Notes (Signed)
Virtual Visit Encounter  telephone visit.   I connected with  Maple Mirza on 11/10/21 at  3:50 PM EST by secure audio and/or video enabled telemedicine application. I verified that I am speaking with the correct person using two identifiers.   I introduced myself as a Designer, jewellery with the practice. The limitations of evaluation and management by telemedicine discussed with the patient and the availability of in person appointments. The patient expressed verbal understanding and consent to proceed.  Participating parties in this visit include: Myself and patient  The patient is: Patient Location: Home I am: Provider Location: Office/Clinic Subjective:    CC and HPI: Roslynn Holte is a 66 y.o. year old female presenting for new evaluation and treatment of seasonal allergies. Blakeley reports she has problems with seasonal allergic rhinitis and sinus pressure starting in Michaeljoseph Revolorio February and usually lasting through the summer. In the past she has used Azelastine with success and she would like to try this again this year, if possible. She has also used claritin with the azelastine, which was helpful. She denies fever, chills, sore throat, cough. She is experiencing the beginning of rhinorrhea and sinus pressure.   Past medical history, Surgical history, Family history not pertinant except as noted below, Social history, Allergies, and medications have been entered into the medical record, reviewed, and corrections made.   Review of Systems:  All review of systems negative except what is listed in the HPI  Objective:    Alert and oriented x 4 Speaking in clear sentences with no shortness of breath. No distress.  Impression and Recommendations:    Problem List Items Addressed This Visit     Seasonal allergic rhinitis - Primary    Chronic starting February through the end of summer.  Will restart Azelastine and Claritin for relief.  Recommend monitoring for any signs of infection and  notifying the office if these occur.  She will follow-up with any signs of infection or decreased effectiveness of medication.       Relevant Medications   azelastine (ASTELIN) 0.1 % nasal spray   loratadine (CLARITIN) 10 MG tablet    orders and follow up as documented in EMR I discussed the assessment and treatment plan with the patient. The patient was provided an opportunity to ask questions and all were answered. The patient agreed with the plan and demonstrated an understanding of the instructions.   The patient was advised to call back or seek an in-person evaluation if the symptoms worsen or if the condition fails to improve as anticipated.  Follow-Up: prn  I provided 10 minutes of non-face-to-face interaction with this non face-to-face encounter including intake, same-day documentation, and chart review.   [name], NP , DNP, AGNP-c [place] [facility] [phone] (fax)

## 2021-11-10 NOTE — Assessment & Plan Note (Signed)
Chronic starting February through the end of summer.  Will restart Azelastine and Claritin for relief.  Recommend monitoring for any signs of infection and notifying the office if these occur.  She will follow-up with any signs of infection or decreased effectiveness of medication.

## 2022-03-05 ENCOUNTER — Encounter (HOSPITAL_BASED_OUTPATIENT_CLINIC_OR_DEPARTMENT_OTHER): Payer: Self-pay | Admitting: Nurse Practitioner

## 2022-03-05 ENCOUNTER — Ambulatory Visit (INDEPENDENT_AMBULATORY_CARE_PROVIDER_SITE_OTHER): Payer: Medicare Other | Admitting: Nurse Practitioner

## 2022-03-05 VITALS — BP 112/72 | HR 96 | Ht 66.0 in | Wt 126.0 lb

## 2022-03-05 DIAGNOSIS — Z9189 Other specified personal risk factors, not elsewhere classified: Secondary | ICD-10-CM | POA: Diagnosis not present

## 2022-03-05 DIAGNOSIS — R110 Nausea: Secondary | ICD-10-CM | POA: Diagnosis not present

## 2022-03-05 DIAGNOSIS — B3731 Acute candidiasis of vulva and vagina: Secondary | ICD-10-CM | POA: Diagnosis not present

## 2022-03-05 MED ORDER — ONDANSETRON HCL 4 MG PO TABS: 4.0000 mg | ORAL_TABLET | Freq: Three times a day (TID) | ORAL | 0 refills | Status: DC | PRN

## 2022-03-05 MED ORDER — FLUCONAZOLE 150 MG PO TABS: ORAL_TABLET | ORAL | 2 refills | Status: DC

## 2022-03-05 MED ORDER — DOXYCYCLINE HYCLATE 100 MG PO TABS: 100.0000 mg | ORAL_TABLET | Freq: Two times a day (BID) | ORAL | 0 refills | Status: DC

## 2022-03-05 NOTE — Patient Instructions (Signed)
It was a pleasure seeing you today. I hope your time spent with [place] was pleasant and helpful. Please let us know if there is anything we can do to improve the service you receive.   Today we discussed concerns with:  Tick bites If your symptoms get worse or you do not feel better when you are getting close to the end of the antibiotic, please let me know.   The following orders have been placed for you today:  Orders Placed This Encounter  Procedures   Rocky mtn spotted fvr abs pnl(IgG+IgM)   Lyme Disease Serology w/Reflex   CBC with Differential/Platelet    Order Specific Question:   Release to patient    Answer:   Immediate     Important Office Information Lab Results If labs were ordered, please note that you will see results through [place] as soon as they come available from [place].  It takes up to 5 business days for the results to be routed to me and for me to review them once all of the lab results have come through from [facility]. I will make recommendations based on your results and send these through [place] or someone from the office will call you to discuss. If your labs are abnormal, we may contact you to schedule a visit to discuss the results and make recommendations.  If you have not heard from [place] within 5 business days or you have waited longer than a week and your lab results have not come through on [place], please feel free to call the office or send a message through [place] to follow-up on these labs.   Referrals If referrals were placed today, the office where the referral was sent will contact you either by phone or through [place] to set up scheduling. Please note that it can take up to a week for the referral office to contact you. If you do not hear from them in a week, please contact the referral office directly to inquire about scheduling.   Condition Treated If your condition worsens or you begin to have new symptoms, please schedule a follow-up appointment  for further evaluation. If you are not sure if an appointment is needed, you may call the office to leave a message for the nurse and someone will contact you with recommendations.  If you have an urgent or life threatening emergency, please do not call the office, but seek emergency evaluation by calling 911 or going to the nearest emergency room for evaluation.   MyChart and Phone Calls Please do not use MyChart for urgent messages. It may take up to 3 business days for MyChart messages to be read by staff and if they are unable to handle the request, an additional 3 business days for them to be routed to me and for my response.  Messages sent to the provider through [place] do not come directly to the provider, please allow time for these messages to be routed and for me to respond.  We get a large volume of MyChart messages daily and these are responded to in the order received.   For urgent messages, please call the office at [phone] and speak with the front office staff or leave a message on the line of my assistant for guidance.  We are seeing patients from the hours of 8:00 am through 5:00 pm and calls directly to the nurse may not be answered immediately due to seeing patients, but your call will be returned as soon  as possible.  Phone  messages received after 4:00 PM Monday through Thursday may not be returned until the following business day. Phone messages received after 11:00 AM on Friday may not be returned until Monday.   After Hours We share on call hours with providers from other offices. If you have an urgent need after hours that cannot wait until the next business day, please contact the on call provider by calling the office number. A nurse will speak with you and contact the provider if needed for recommendations.  If you have an urgent or life threatening emergency after hours, please do not call the on call provider, but seek emergency evaluation by calling 911 or going to  the nearest emergency room for evaluation.   Paperwork All paperwork requires a minimum of 5 days to complete and return to you or the designated personnel. Please keep this in mind when bringing in forms or sending requests for paperwork completion to the office.

## 2022-03-05 NOTE — Progress Notes (Signed)
[name], DNP, AGNP-c Primary Care & Sports Medicine [address]  [place], [place] [address] [phone]  Subjective:   Jaclyn Spencer is a 66 y.o. female presents to day for evaluation of tick bites.   Tick bites Jaclyn Spencer reports that over the weekend she was outside working in the yard.  A few hours later she noticed to very small ticks attached to her abdomen superior to the umbilicus. She reports that she removed these ticks without incident and disposed of them. She has had tick bites in the past with no issue. She reports that starting Jaclyn Spencer this week she began to develop a headache, body aches, and upset stomach.  She has also had some chills She denies any known rash or fevers She is particularly concerned because her husband developed [facility] spotted fever a few years ago and therefore she noticed that this is prevalent in her area.  PMH, Medications, and Allergies reviewed and updated in chart.   ROS negative except for what is listed in HPI. Objective:  BP 112/72   Pulse 96   Ht '5\' 6"'$  (1.676 m)   Wt 126 lb (57.2 kg)   SpO2 99%   BMI 20.34 kg/m  Physical Exam Vitals and nursing note reviewed.  Constitutional:      General: She is not in acute distress.    Appearance: Normal appearance.  HENT:     Head: Normocephalic.  Eyes:     Extraocular Movements: Extraocular movements intact.     Conjunctiva/sclera: Conjunctivae normal.     Pupils: Pupils are equal, round, and reactive to light.  Neck:     Vascular: No carotid bruit.  Cardiovascular:     Rate and Rhythm: Normal rate and regular rhythm.     Pulses: Normal pulses.     Heart sounds: Normal heart sounds. No murmur heard. Pulmonary:     Effort: Pulmonary effort is normal.     Breath sounds: Normal breath sounds. No wheezing.  Abdominal:     General: Bowel sounds are normal. There is no distension.     Palpations: Abdomen is soft.     Tenderness: There is no abdominal  tenderness. There is no guarding.  Musculoskeletal:        General: Normal range of motion.     Cervical back: Normal range of motion and neck supple.     Right lower leg: No edema.     Left lower leg: No edema.  Lymphadenopathy:     Cervical: No cervical adenopathy.  Skin:    General: Skin is warm and dry.     Capillary Refill: Capillary refill takes less than 2 seconds.     Findings: Erythema and lesion present.          Comments: 2 areas of erythema present on the abdomen superior to the umbilicus at the midline.  Mild swelling surrounding the lesions.  Lesions do show evidence of a darker crusting at the center.  No warmth or drainage to the area.  Neurological:     General: No focal deficit present.     Mental Status: She is alert and oriented to person, place, and time.     Sensory: No sensory deficit.     Motor: No weakness.  Psychiatric:        Mood and Affect: Mood normal.        Behavior: Behavior normal.        Thought Content: Thought content normal.  Judgment: Judgment normal.           Assessment & Plan:   Problem List Items Addressed This Visit     At high risk for tick borne illness - Primary    2 tick bites on the abdomen from this past weekend.  Unclear how long the ticks were attached prior to discovery and removal.  Given the patient's symptoms I do feel it is appropriate to begin antibiotic therapy.  Discussed option of prophylactic therapy while we await testing results versus full treatment.  Patient will be going out of town this weekend therefore we will go ahead and begin full course treatment out of prophylactic management due to elevated risk.       Relevant Medications   doxycycline (VIBRA-TABS) 100 MG tablet   Other Relevant Orders   Rocky mtn spotted fvr abs pnl(IgG+IgM)   Lyme Disease Serology w/Reflex   CBC with Differential/Platelet   Nausea    Nausea without vomiting in the setting of tick bites.  Antiemetic sent for PRN use.       Relevant Medications   ondansetron (ZOFRAN) 4 MG tablet   Yeast vaginitis    Patient currently taking antibiotics with high risk of yeast infection.  We will send in Diflucan prophylactically.      Relevant Medications   fluconazole (DIFLUCAN) 150 MG tablet     [name], DNP, AGNP-c 03/06/2022  4:00 PM

## 2022-03-06 DIAGNOSIS — Z9189 Other specified personal risk factors, not elsewhere classified: Secondary | ICD-10-CM | POA: Insufficient documentation

## 2022-03-06 DIAGNOSIS — R110 Nausea: Secondary | ICD-10-CM | POA: Insufficient documentation

## 2022-03-06 DIAGNOSIS — B3731 Acute candidiasis of vulva and vagina: Secondary | ICD-10-CM | POA: Insufficient documentation

## 2022-03-06 HISTORY — DX: Nausea: R11.0

## 2022-03-06 HISTORY — DX: Other specified personal risk factors, not elsewhere classified: Z91.89

## 2022-03-06 NOTE — Assessment & Plan Note (Signed)
2 tick bites on the abdomen from this past weekend.  Unclear how long the ticks were attached prior to discovery and removal.  Given the patient's symptoms I do feel it is appropriate to begin antibiotic therapy.  Discussed option of prophylactic therapy while we await testing results versus full treatment.  Patient will be going out of town this weekend therefore we will go ahead and begin full course treatment out of prophylactic management due to elevated risk.

## 2022-03-06 NOTE — Assessment & Plan Note (Signed)
Patient currently taking antibiotics with high risk of yeast infection.  We will send in Diflucan prophylactically.

## 2022-03-06 NOTE — Assessment & Plan Note (Signed)
Nausea without vomiting in the setting of tick bites.  Antiemetic sent for PRN use.

## 2022-03-11 LAB — CBC WITH DIFFERENTIAL/PLATELET
Basophils Absolute: 0.1 10*3/uL (ref 0.0–0.2)
Basos: 1 %
EOS (ABSOLUTE): 0.1 10*3/uL (ref 0.0–0.4)
Eos: 1 %
Hematocrit: 39.6 % (ref 34.0–46.6)
Hemoglobin: 13.8 g/dL (ref 11.1–15.9)
Immature Grans (Abs): 0 10*3/uL (ref 0.0–0.1)
Immature Granulocytes: 0 %
Lymphocytes Absolute: 2 10*3/uL (ref 0.7–3.1)
Lymphs: 36 %
MCH: 32.9 pg (ref 26.6–33.0)
MCHC: 34.8 g/dL (ref 31.5–35.7)
MCV: 95 fL (ref 79–97)
Monocytes Absolute: 0.5 10*3/uL (ref 0.1–0.9)
Monocytes: 8 %
Neutrophils Absolute: 3 10*3/uL (ref 1.4–7.0)
Neutrophils: 54 %
Platelets: 236 10*3/uL (ref 150–450)
RBC: 4.19 x10E6/uL (ref 3.77–5.28)
RDW: 12.9 % (ref 11.7–15.4)
WBC: 5.6 10*3/uL (ref 3.4–10.8)

## 2022-03-11 LAB — LYME DISEASE SEROLOGY W/REFLEX: Lyme Total Antibody EIA: NEGATIVE

## 2022-03-11 LAB — ROCKY MTN SPOTTED FVR ABS PNL(IGG+IGM)
RMSF IgG: UNDETERMINED
RMSF IgM: 0.3 index (ref 0.00–0.89)

## 2022-03-11 LAB — RMSF, IGG, IFA: RMSF, IGG, IFA: <1:64 {titer}

## 2022-06-17 NOTE — Progress Notes (Signed)
06/22/2022 Jaclyn Spencer [id number] Jun 06, 1956  Referring provider: [name], NP Primary GI doctor: Jaclyn Spencer  ASSESSMENT AND PLAN:   Assessment: 66 y.o. female here for assessment of the following: 1. Gastroesophageal reflux disease, unspecified whether esophagitis present   2. Esophageal dysphagia   3. Cricopharyngeal achalasia   4. Benign neoplasm of cecum   5. Gastroesophageal reflux disease with esophagitis without hemorrhage   6. Vitamin D deficiency   7. B12 deficiency    10/10/20: Colonoscopy 14 mm polyp removed, 52m polyp , diverticulosis and hemorrhoids. 10/21/2020 barium esophagram Prominent cricopharyngeal impression on posterior cervical esophagus during swallowing, 13 mm barium tablet became stuck at this level, passed after multiple large swallows of water.  No Zenker's diverticulum Cricopharyngeal achalasia 11/2020 EGD 2 cm hiatal hernia, Hill grade 3, LA grade B esophagitis, normal stomach, unremarkable biopsies.  Concerned for long term PPI use and vitamin defenciancy, states has in the past. Will check this OV.   Plan: Lifestyle changes discussed, avoid NSAIDS, ETOH Can do trial of PPI once daily while traveling, can try pepcid 40 mg BID when she returns if she wants.  We discussed anal fissures and long healing process Sitz baths, high-fiber diet, add on benefiber.  Long discussion about preventing constipation discussed in detail for prevention.  Diltiazem2%/lidocaine 5% 3 x daily for 2 months on rectum #30 gram 1 refill Follow up 2-3 months  Orders Placed This Encounter  Procedures   CBC with Differential/Platelet   Comprehensive metabolic panel   Vitamin BP59  Vitamin D 1,25 dihydroxy    Meds ordered this encounter  Medications   pantoprazole (PROTONIX) 40 MG tablet    Sig: Take 1 tablet (40 mg total) by mouth daily.    Dispense:  90 tablet    Refill:  3   famotidine (PEPCID) 40 MG tablet    Sig: Take 1 tablet (40 mg total) by mouth 2  (two) times daily.    Dispense:  180 tablet    Refill:  0   NON FORMULARY    Sig: Diltiazem 2%/Lidocaine5% compound Use 3 x rectally daily for 2 months to heal anal fissure    Dispense:  30 g    Refill:  1    History of Present Illness:  66y.o. female  with a past medical history of GERD with esophageal dysphagia, constipation, vitamin D deficiency, B12 deficiency and others listed below, returns to clinic today for evaluation of GERD and hemorrhoids.  10/10/20: EGD Upper esophageal sphincter spasm, unable to intubate esophagus 10/10/20: Colonoscopy 14 mm polyp removed, 23mpolyp , diverticulosis and hemorrhoids. 10/21/2020 barium esophagram Prominent cricopharyngeal impression on posterior cervical esophagus during swallowing, 13 mm barium tablet became stuck at this level, passed after multiple large swallows of water.  No Zenker's diverticulum Cricopharyngeal achalasia 11/2020 EGD 2 cm hiatal hernia, Hill grade 3, LA grade B esophagitis, normal stomach, unremarkable biopsies. 02/17/2021 office visit with Dr. NaSilverio Decampor dysphagia and need on medication, consider MRI neck to look for osteophyte.  She states she tries to manage her GERD with diet as much as possible but planning on going on  She has some concern with long term PPI use, she has switched to pepcid in two months. Off the pantoprazole 20 mg and only pepcid 10 mg in AM and 20 mg at night. Night time is worse.  Unchanged dysphagia.  States hemorrhoids have flared worse in last 6 weeks, she has a constant rawness with burning/itching.  She  has had more gas and has tendency towards loose stools.  Has more AB spasms, no bloating, with lower AB pain.  Denies melena, hematochezia.   She  reports that she quit smoking about 45 years ago. Her smoking use included cigarettes. She has never used smokeless tobacco. She reports current alcohol use. She reports that she does not use drugs. Her family history includes Arthritis in her  sister; Cancer in her brother; Colitis in her mother; Coronary artery disease in her paternal grandfather; Coronary artery disease (age of onset: 39) in her mother; Heart attack in her mother; Inflammatory bowel disease in her mother; Kidney cancer in her mother; Lung cancer in her father; Stomach cancer in her paternal aunt and paternal grandmother.   Current Medications:     Current Outpatient Medications (Respiratory):    azelastine (ASTELIN) 0.1 % nasal spray, Place 2 sprays into both nostrils 2 (two) times daily. Use in each nostril as directed   loratadine (CLARITIN) 10 MG tablet, Take 1 tablet (10 mg total) by mouth daily.    Current Outpatient Medications (Other):    dicyclomine (BENTYL) 10 MG capsule, Take 1 capsule (10 mg total) by mouth every 8 (eight) hours as needed for spasms.   famotidine (PEPCID) 40 MG tablet, Take 1 tablet (40 mg total) by mouth 2 (two) times daily.   fluconazole (DIFLUCAN) 150 MG tablet, Take one tablet by mouth at the first sign of symptoms of yeast. If no resolution, repeat dose in 72 hours.   hydrocortisone (ANUSOL-HC) 2.5 % rectal cream, Place 1 application rectally at bedtime as needed for hemorrhoids or anal itching.   Multiple Vitamins-Minerals (MULTIVITAMIN WITH MINERALS) tablet, Take 1 tablet by mouth 3 (three) times a week.   NON FORMULARY, Diltiazem 2%/Lidocaine5% compound Use 3 x rectally daily for 2 months to heal anal fissure   ondansetron (ZOFRAN) 4 MG tablet, Take 1 tablet (4 mg total) by mouth every 8 (eight) hours as needed for nausea or vomiting.   pantoprazole (PROTONIX) 40 MG tablet, Take 1 tablet (40 mg total) by mouth daily.  Surgical History:  She  has a past surgical history that includes Mohs surgery and Esophagogastroduodenoscopy (egd) with propofol (N/A, 12/03/2020).  Current Medications, Allergies, Past Medical History, Past Surgical History, Family History and Social History were reviewed in [employer]  record.  Physical Exam: BP 102/60   Pulse 83   Ht '5\' 6"'$  (1.676 m)   Wt 124 lb (56.2 kg)   BMI 20.01 kg/m  General:   Pleasant, well developed female in no acute distress Heart : Regular rate and rhythm; no murmurs Pulm: Clear anteriorly; no wheezing Abdomen:  Soft, Flat AB, Active bowel sounds. No tenderness . , No organomegaly appreciated. Rectal: anterior fissure noted, normal rectal tone, internal hemorrhoids appreciated, no masses, non tender, brown stool, hemoccult negative Extremities:  without  edema. Neurologic:  Alert and  oriented x4;  No focal deficits.  Psych:  Cooperative. Normal mood and affect.   Vladimir Crofts, PA-C 06/22/22

## 2022-06-22 ENCOUNTER — Other Ambulatory Visit (INDEPENDENT_AMBULATORY_CARE_PROVIDER_SITE_OTHER): Payer: Medicare Other

## 2022-06-22 ENCOUNTER — Encounter: Payer: Self-pay | Admitting: Physician Assistant

## 2022-06-22 ENCOUNTER — Ambulatory Visit (INDEPENDENT_AMBULATORY_CARE_PROVIDER_SITE_OTHER): Payer: Medicare Other | Admitting: Physician Assistant

## 2022-06-22 VITALS — BP 102/60 | HR 83 | Ht 66.0 in | Wt 124.0 lb

## 2022-06-22 DIAGNOSIS — E538 Deficiency of other specified B group vitamins: Secondary | ICD-10-CM | POA: Diagnosis not present

## 2022-06-22 DIAGNOSIS — E559 Vitamin D deficiency, unspecified: Secondary | ICD-10-CM

## 2022-06-22 DIAGNOSIS — K219 Gastro-esophageal reflux disease without esophagitis: Secondary | ICD-10-CM

## 2022-06-22 DIAGNOSIS — D120 Benign neoplasm of cecum: Secondary | ICD-10-CM | POA: Diagnosis not present

## 2022-06-22 DIAGNOSIS — K2100 Gastro-esophageal reflux disease with esophagitis, without bleeding: Secondary | ICD-10-CM

## 2022-06-22 DIAGNOSIS — R1319 Other dysphagia: Secondary | ICD-10-CM | POA: Diagnosis not present

## 2022-06-22 DIAGNOSIS — K220 Achalasia of cardia: Secondary | ICD-10-CM | POA: Diagnosis not present

## 2022-06-22 LAB — CBC WITH DIFFERENTIAL/PLATELET
Basophils Absolute: 0 10*3/uL (ref 0.0–0.1)
Basophils Relative: 0.7 % (ref 0.0–3.0)
Eosinophils Absolute: 0.1 10*3/uL (ref 0.0–0.7)
Eosinophils Relative: 1.1 % (ref 0.0–5.0)
HCT: 40.1 % (ref 36.0–46.0)
Hemoglobin: 13.9 g/dL (ref 12.0–15.0)
Lymphocytes Relative: 38.7 % (ref 12.0–46.0)
Lymphs Abs: 2.1 10*3/uL (ref 0.7–4.0)
MCHC: 34.6 g/dL (ref 30.0–36.0)
MCV: 94.1 fl (ref 78.0–100.0)
Monocytes Absolute: 0.5 10*3/uL (ref 0.1–1.0)
Monocytes Relative: 8.7 % (ref 3.0–12.0)
Neutro Abs: 2.7 10*3/uL (ref 1.4–7.7)
Neutrophils Relative %: 50.8 % (ref 43.0–77.0)
Platelets: 223 10*3/uL (ref 150.0–400.0)
RBC: 4.26 Mil/uL (ref 3.87–5.11)
RDW: 13.2 % (ref 11.5–15.5)
WBC: 5.3 10*3/uL (ref 4.0–10.5)

## 2022-06-22 LAB — COMPREHENSIVE METABOLIC PANEL
ALT: 13 U/L (ref 0–35)
AST: 17 U/L (ref 0–37)
Albumin: 4.6 g/dL (ref 3.5–5.2)
Alkaline Phosphatase: 47 U/L (ref 39–117)
BUN: 9 mg/dL (ref 6–23)
CO2: 28 mEq/L (ref 19–32)
Calcium: 9.4 mg/dL (ref 8.4–10.5)
Chloride: 103 mEq/L (ref 96–112)
Creatinine, Ser: 0.56 mg/dL (ref 0.40–1.20)
GFR: 95.51 mL/min (ref >60.00)
Glucose, Bld: 93 mg/dL (ref 70–99)
Potassium: 4.3 mEq/L (ref 3.5–5.1)
Sodium: 141 mEq/L (ref 135–145)
Total Bilirubin: 0.8 mg/dL (ref 0.2–1.2)
Total Protein: 7.6 g/dL (ref 6.0–8.3)

## 2022-06-22 LAB — VITAMIN B12: Vitamin B-12: 884 pg/mL (ref 211–911)

## 2022-06-22 MED ORDER — NON FORMULARY: 1 refills | Status: DC

## 2022-06-22 MED ORDER — FAMOTIDINE 40 MG PO TABS: 40.0000 mg | ORAL_TABLET | Freq: Two times a day (BID) | ORAL | 0 refills | Status: DC

## 2022-06-22 MED ORDER — PANTOPRAZOLE SODIUM 40 MG PO TBEC: 40.0000 mg | DELAYED_RELEASE_TABLET | Freq: Every day | ORAL | 3 refills | Status: DC

## 2022-06-22 NOTE — Patient Instructions (Addendum)
Your provider has requested that you go to the basement level for lab work before leaving today. Press "B" on the elevator. The lab is located at the first door on the left as you exit the elevator.  We have sent the following medications to your pharmacy for you to pick up at your convenience: [place]   Your provider has prescribed Diltiazem/Lidocaine gel for you. Please follow the directions written on your prescription bottle or given to you specifically by your provider. Since this is a specialty medication and is not readily available at most local pharmacies, we have sent your prescription to:  [facility] information is below: Address: [address], [place], [place] [address]  [phone]  *Please DO NOT go directly from our office to pick up this medication! Give the pharmacy 1 day to process the prescription as this is compounded and takes time to make.   Due to recent changes in healthcare laws, you may see the results of your imaging and laboratory studies on MyChart before your provider has had a chance to review them.  We understand that in some cases there may be results that are confusing or concerning to you. Not all laboratory results come back in the same time frame and the provider may be waiting for multiple results in order to interpret others.  Please give [place] 48 hours in order for your provider to thoroughly review all the results before contacting the office for clarification of your results.     Please take your proton pump inhibitor medication, daily or every other day while traveling, can do pepcid twice a day.   Please take this medication 30 minutes to 1 hour before meals- this makes it more effective.  Avoid spicy and acidic foods Avoid fatty foods Limit your intake of coffee, tea, alcohol, and carbonated drinks Work to maintain a healthy weight Keep the head of the bed elevated at least 3 inches with blocks or a wedge pillow if you  are having any nighttime symptoms Stay upright for 2 hours after eating Avoid meals and snacks three to four hours before bedtime  Silent reflux: Not all heartburn burns...Marland KitchenMarland KitchenMarland Kitchen  What is LPR? Laryngopharyngeal reflux (LPR) or silent reflux is a condition in which acid that is made in the stomach travels up the esophagus (swallowing tube) and gets to the throat. Not everyone with reflux has a lot of heartburn or indigestion. In fact, many people with LPR never have heartburn. This is why LPR is called SILENT REFLUX, and the terms "Silent reflux" and "LPR" are often used interchangeably. Because LPR is silent, it is sometimes difficult to diagnose.  How can you tell if you have LPR?  Chronic hoarseness- Some people have hoarseness that comes and goes throat clearing  Cough It can cause shortness of breath and cause asthma like symptoms. a feeling of a lump in the throat  difficulty swallowing a problem with too much nose and throat drainage.  Some people will feel their esophagus spasm which feels like their heart beating hard and fast, this will usually be after a meal, at rest, or lying down at night.    How do I treat this? Treatment for LPR should be individualized, and your doctor will suggest the best treatment for you. Generally there are several treatments for LPR: changing habits and diet to reduce reflux,  medications to reduce stomach acid, and  surgery to prevent reflux. Most people with LPR need to modify how  and when they eat, as well as take some medication, to get well. Sometimes, nonprescription liquid antacids, such as Maalox, Gelucil and Mylanta are recommended. When used, these antacids should be taken four times each day - one tablespoon one hour after each meal and before bedtime. Dietary and lifestyle changes alone are not often enough to control LPR - medications that reduce stomach acid are also usually needed. These must be prescribed by our doctor.  Anal Fissure,  Adult  Diltiazem/lidocaine 3 x daily for 2 months sent to compound pharmacy    Sent this medication to a compound pharmacy:  [facility] [place], [place], [place] [address]   Please DO NOT go directly from our office to pick up this medication! Give the pharmacy 1 day to process the prescription. Extra time is required for them to compound your medication.  Follow up should symptoms persist for secondary evaluation and possible surgical referral for repair.  Can do sitz bath get from pharmacy Can continue the hemorrhoid cream  First do a trial off milk/lactose products if you use them.  Add fiber like benefiber or citracel once a day Increase activity Can do trial of IBGard which is over the counter for AB pain- Take 1-2 capsules once a day for maintence or twice a day during a flare if any worsening symptoms like blood in stool, weight loss, please call the office   Please try low FODMAP diet- see below- start with eliminating just one column at a time, the table at the very bottom contains foods that are safe to take   FODMAP stands for fermentable oligo-, di-, mono-saccharides and polyols (1). These are the scientific terms used to classify groups of carbs that are notorious for triggering digestive symptoms like bloating, gas and stomach pain.

## 2022-06-27 LAB — VITAMIN D 1,25 DIHYDROXY
Vitamin D 1, 25 (OH)2 Total: 44 pg/mL (ref 18–72)
Vitamin D2 1, 25 (OH)2: <8 pg/mL
Vitamin D3 1, 25 (OH)2: 44 pg/mL

## 2022-07-03 ENCOUNTER — Encounter (HOSPITAL_BASED_OUTPATIENT_CLINIC_OR_DEPARTMENT_OTHER): Payer: Self-pay | Admitting: Nurse Practitioner

## 2022-09-04 ENCOUNTER — Ambulatory Visit (INDEPENDENT_AMBULATORY_CARE_PROVIDER_SITE_OTHER): Payer: Medicare Other | Admitting: Nurse Practitioner

## 2022-09-04 ENCOUNTER — Encounter: Payer: Self-pay | Admitting: Nurse Practitioner

## 2022-09-04 VITALS — BP 110/72 | HR 78 | Ht 65.0 in | Wt 127.8 lb

## 2022-09-04 DIAGNOSIS — Z23 Encounter for immunization: Secondary | ICD-10-CM

## 2022-09-04 DIAGNOSIS — Z0000 Encounter for general adult medical examination without abnormal findings: Secondary | ICD-10-CM | POA: Insufficient documentation

## 2022-09-04 DIAGNOSIS — K602 Anal fissure, unspecified: Secondary | ICD-10-CM | POA: Diagnosis not present

## 2022-09-04 DIAGNOSIS — M25511 Pain in right shoulder: Secondary | ICD-10-CM

## 2022-09-04 DIAGNOSIS — E559 Vitamin D deficiency, unspecified: Secondary | ICD-10-CM

## 2022-09-04 DIAGNOSIS — Z1231 Encounter for screening mammogram for malignant neoplasm of breast: Secondary | ICD-10-CM

## 2022-09-04 DIAGNOSIS — N952 Postmenopausal atrophic vaginitis: Secondary | ICD-10-CM

## 2022-09-04 DIAGNOSIS — E538 Deficiency of other specified B group vitamins: Secondary | ICD-10-CM

## 2022-09-04 DIAGNOSIS — M79671 Pain in right foot: Secondary | ICD-10-CM

## 2022-09-04 DIAGNOSIS — E7800 Pure hypercholesterolemia, unspecified: Secondary | ICD-10-CM

## 2022-09-04 DIAGNOSIS — G8929 Other chronic pain: Secondary | ICD-10-CM

## 2022-09-04 DIAGNOSIS — K2100 Gastro-esophageal reflux disease with esophagitis, without bleeding: Secondary | ICD-10-CM

## 2022-09-04 HISTORY — DX: Encounter for screening mammogram for malignant neoplasm of breast: Z12.31

## 2022-09-04 HISTORY — DX: Encounter for immunization: Z23

## 2022-09-04 HISTORY — DX: Pain in right foot: M79.671

## 2022-09-04 HISTORY — DX: Anal fissure, unspecified: K60.2

## 2022-09-04 MED ORDER — PANTOPRAZOLE SODIUM 20 MG PO TBEC: DELAYED_RELEASE_TABLET | ORAL | 1 refills | Status: DC

## 2022-09-04 MED ORDER — PREMARIN 0.625 MG/GM VA CREA: 1.0000 | TOPICAL_CREAM | Freq: Every day | VAGINAL | 12 refills | Status: DC

## 2022-09-04 NOTE — Patient Instructions (Addendum)
I have included some stretches and information for you to try. If the symptoms do not get better over the next 6 weeks or so, we can always send you to a foot specialist to see if there is anything else than can be done.   Try the shoulder exercises I gave you and see if these help.

## 2022-09-04 NOTE — Assessment & Plan Note (Signed)
Chronic. Reviewed most recent labs from GI. Levels good at this time. Will continue to monitor.

## 2022-09-04 NOTE — Assessment & Plan Note (Signed)
Right shoulder pain with certain movements. Nature if fluctuating with periods of improvement. Recommend she continue with at home exercises. If this is still bothering her we can send referral for ortho for evaluation.

## 2022-09-04 NOTE — Progress Notes (Signed)
Worthy Keeler, DNP, AGNP-c [place], [place] [address] Main Office [phone]  BP 110/72   Pulse 78   Ht '5\' 5"'$  (1.651 m)   Wt 127 lb 12.8 oz (58 kg)   BMI 21.27 kg/m    Subjective:    Patient ID: Jaclyn Spencer, female    DOB: 1955-11-20, 66 y.o.   MRN: [id number]  HPI: Jaclyn Spencer is a 66 y.o. female presenting on 09/04/2022 for comprehensive medical examination.   Current medical concerns include: Heel pain: when she wakes in the morning it is painful. It has been bothering her for over a month. She has been changing her shoes. On the right heel, right toe is numb and has been for a while.   She reports regular vision exams q1-5y: Yes  She reports regular dental exams q 45m  Yes  The patient eats a regular, healthy diet. She is avoiding gluten and dairy. She endorses exercise and/or activity of:  [employer] 20 minute television exercise, and walking  She endorses the following: Marital Status: single Sexual: not sexually active Activity/nutrition: normal balanced diet  She denies concerns with STI today, testing was not ordered  A comprehensive review of systems was negative.  Most Recent Depression Screen:     09/04/2022    8:35 AM 03/05/2022   11:09 AM 08/14/2021    5:52 PM 07/08/2021   10:52 AM 03/16/2020   10:40 PM  Depression screen PHQ 2/9  Decreased Interest 0 0 1 0 0  Down, Depressed, Hopeless 0 0 0 0 1  PHQ - 2 Score 0 0 1 0 1  Altered sleeping   '1 1 2  '$ Tired, decreased energy   '1 1 1  '$ Change in appetite   0 0 0  Feeling bad or failure about yourself    0 0 0  Trouble concentrating   0 0 0  Moving slowly or fidgety/restless   0 0 0  Suicidal thoughts   0 0 0  PHQ-9 Score   '3 2 4  '$ Difficult doing work/chores   Not difficult at all Not difficult at all Somewhat difficult   Most Recent Anxiety Screen:     08/14/2021    5:52 PM 07/08/2021   10:53 AM 03/16/2020   10:39 PM  GAD 7 : Generalized Anxiety  Score  Nervous, Anxious, on Edge 0 1 1  Control/stop worrying 0 0 1  Worry too much - different things 1 1 0  Trouble relaxing 1 0 1  Restless 0 0 0  Easily annoyed or irritable 1 0 1  Afraid - awful might happen 1 0 2  Total GAD 7 Score '4 2 6  '$ Anxiety Difficulty Not difficult at all Not difficult at all Somewhat difficult   Most Recent Fall Screen:    09/04/2022    8:35 AM 03/05/2022   11:08 AM 08/14/2021    5:51 PM 07/08/2021   10:52 AM 09/27/2012   10:39 AM  Fall Risk   Falls in the past year? 0 0 0 0   Number falls in past yr: 0 0 0 0   Injury with Fall? 0 0 0 0   Risk for fall due to : No Fall Risks No Fall Risks No Fall Risks No Fall Risks Impaired mobility  Follow up Falls evaluation completed Falls evaluation completed;Education provided Falls evaluation completed;Education provided      Past medical history, surgical history, medications, allergies, family history and social history  reviewed with patient today and changes made to appropriate areas of the chart.  Past Medical History:  Past Medical History:  Diagnosis Date   Abdominal pain    Abdominal pain, left upper quadrant 02/27/2010   Qualifier: Diagnosis of  By: Olevia Perches MD, Lowella Bandy    Abdominal pain, right lower quadrant 05/29/2010   Qualifier: Diagnosis of  By: Birdie Riddle MD, Belenda Cruise     Allergy    rhinitis   Anxiety    At high risk for tick borne illness 03/06/2022   B12 deficiency    Basal cell carcinoma    CHEST PAIN 05/29/2010   Qualifier: Diagnosis of  By: Birdie Riddle MD, Katherine     Diverticulosis    Encounter for screening colonoscopy 07/16/2016   Epigastric pain 08/27/2011   GERD (gastroesophageal reflux disease)    Insomnia 07/16/2016   Left-sided muscle weakness 07/08/2021   Loss of weight 02/27/2010   Qualifier: Diagnosis of   By: Olevia Perches MD, Lowella Bandy      Nausea 03/06/2022   Shoulder pain 08/27/2011   Tick bite 03/08/2012   Vitamin D deficiency    Weight loss    Medications:  Current  Outpatient Medications on File Prior to Visit  Medication Sig   hydrocortisone (ANUSOL-HC) 2.5 % rectal cream Place 1 application rectally at bedtime as needed for hemorrhoids or anal itching.   loratadine (CLARITIN) 10 MG tablet Take 1 tablet (10 mg total) by mouth daily.   Multiple Vitamins-Minerals (MULTIVITAMIN WITH MINERALS) tablet Take 1 tablet by mouth 3 (three) times a week.   NON FORMULARY Diltiazem 2%/Lidocaine5% compound Use 3 x rectally daily for 2 months to heal anal fissure   pantoprazole (PROTONIX) 40 MG tablet Take 1 tablet (40 mg total) by mouth daily.   azelastine (ASTELIN) 0.1 % nasal spray Place 2 sprays into both nostrils 2 (two) times daily. Use in each nostril as directed (Patient not taking: Reported on 09/04/2022)   dicyclomine (BENTYL) 10 MG capsule Take 1 capsule (10 mg total) by mouth every 8 (eight) hours as needed for spasms. (Patient not taking: Reported on 09/04/2022)   fluconazole (DIFLUCAN) 150 MG tablet Take one tablet by mouth at the first sign of symptoms of yeast. If no resolution, repeat dose in 72 hours. (Patient not taking: Reported on 09/04/2022)   ondansetron (ZOFRAN) 4 MG tablet Take 1 tablet (4 mg total) by mouth every 8 (eight) hours as needed for nausea or vomiting. (Patient not taking: Reported on 09/04/2022)   No current facility-administered medications on file prior to visit.   Surgical History:  Past Surgical History:  Procedure Laterality Date   ESOPHAGOGASTRODUODENOSCOPY (EGD) WITH PROPOFOL N/A 12/03/2020   Procedure: ESOPHAGOGASTRODUODENOSCOPY (EGD) WITH PROPOFOL;  Surgeon: Mauri Pole, MD;  Location: [facility];  Service: Endoscopy;  Laterality: N/A;   MOHS SURGERY     nose   Allergies:  No Known Allergies Family History:  Family History  Problem Relation Age of Onset   Colitis Mother    Inflammatory bowel disease Mother    Coronary artery disease Mother 65       Stents   Heart attack Mother    Kidney cancer Mother    Lung  cancer Father    Coronary artery disease Paternal Grandfather    Stomach cancer Paternal Grandmother    Stomach cancer Paternal Aunt    Arthritis Sister    Cancer Brother        melanoma   Diabetes Neg Hx  Hyperlipidemia Neg Hx    Hypertension Neg Hx    Sudden death Neg Hx    Esophageal cancer Neg Hx    Pancreatic cancer Neg Hx    Liver disease Neg Hx    Colon cancer Neg Hx        Objective:    BP 110/72   Pulse 78   Ht '5\' 5"'$  (1.651 m)   Wt 127 lb 12.8 oz (58 kg)   BMI 21.27 kg/m   Wt Readings from Last 3 Encounters:  09/04/22 127 lb 12.8 oz (58 kg)  06/22/22 124 lb (56.2 kg)  03/05/22 126 lb (57.2 kg)    Physical Exam Vitals and nursing note reviewed.  Constitutional:      General: She is not in acute distress.    Appearance: Normal appearance.  HENT:     Head: Normocephalic and atraumatic.     Right Ear: Hearing, tympanic membrane, ear canal and external ear normal.     Left Ear: Hearing, tympanic membrane, ear canal and external ear normal.     Nose: Nose normal.     Right Sinus: No maxillary sinus tenderness or frontal sinus tenderness.     Left Sinus: No maxillary sinus tenderness or frontal sinus tenderness.     Mouth/Throat:     Lips: Pink.     Mouth: Mucous membranes are moist.     Pharynx: Oropharynx is clear.  Eyes:     General: Lids are normal. Vision grossly intact.     Extraocular Movements: Extraocular movements intact.     Conjunctiva/sclera: Conjunctivae normal.     Pupils: Pupils are equal, round, and reactive to light.     Funduscopic exam:    Right eye: Red reflex present.        Left eye: Red reflex present.    Visual Fields: Right eye visual fields normal and left eye visual fields normal.  Neck:     Thyroid: No thyromegaly.     Vascular: No carotid bruit.  Cardiovascular:     Rate and Rhythm: Normal rate and regular rhythm.     Chest Wall: PMI is not displaced.     Pulses: Normal pulses.          Dorsalis pedis pulses are 2+ on  the right side and 2+ on the left side.       Posterior tibial pulses are 2+ on the right side and 2+ on the left side.     Heart sounds: Normal heart sounds. No murmur heard. Pulmonary:     Effort: Pulmonary effort is normal. No respiratory distress.     Breath sounds: Normal breath sounds.  Abdominal:     General: Abdomen is flat. Bowel sounds are normal. There is no distension.     Palpations: Abdomen is soft. There is no hepatomegaly, splenomegaly or mass.     Tenderness: There is no abdominal tenderness. There is no right CVA tenderness, left CVA tenderness, guarding or rebound.  Musculoskeletal:        General: Normal range of motion.     Cervical back: Full passive range of motion without pain, normal range of motion and neck supple. No tenderness.     Right lower leg: No edema.     Left lower leg: No edema.     Comments: Left shoulder tenderness on posterior side. Strength intact.   Feet:     Left foot:     Toenail Condition: Left toenails are normal.  Lymphadenopathy:  Cervical: No cervical adenopathy.     Upper Body:     Right upper body: No supraclavicular adenopathy.     Left upper body: No supraclavicular adenopathy.  Skin:    General: Skin is warm and dry.     Capillary Refill: Capillary refill takes less than 2 seconds.     Nails: There is no clubbing.  Neurological:     General: No focal deficit present.     Mental Status: She is alert and oriented to person, place, and time.     GCS: GCS eye subscore is 4. GCS verbal subscore is 5. GCS motor subscore is 6.     Sensory: Sensation is intact.     Motor: Motor function is intact.     Coordination: Coordination is intact.     Gait: Gait is intact.     Deep Tendon Reflexes: Reflexes are normal and symmetric.  Psychiatric:        Attention and Perception: Attention normal.        Mood and Affect: Mood normal.        Speech: Speech normal.        Behavior: Behavior normal. Behavior is cooperative.        Thought  Content: Thought content normal.        Cognition and Memory: Cognition and memory normal.        Judgment: Judgment normal.     Results for orders placed or performed in visit on 06/22/22  Vitamin D 1,25 dihydroxy  Result Value Ref Range   Vitamin D 1, 25 (OH)2 Total 44 18 - 72 pg/mL   Vitamin D3 1, 25 (OH)2 44 pg/mL   Vitamin D2 1, 25 (OH)2 <8 pg/mL  Vitamin B12  Result Value Ref Range   Vitamin B-12 884 211 - 911 pg/mL  Comprehensive metabolic panel  Result Value Ref Range   Sodium 141 135 - 145 mEq/L   Potassium 4.3 3.5 - 5.1 mEq/L   Chloride 103 96 - 112 mEq/L   CO2 28 19 - 32 mEq/L   Glucose, Bld 93 70 - 99 mg/dL   BUN 9 6 - 23 mg/dL   Creatinine, Ser 0.56 0.40 - 1.20 mg/dL   Total Bilirubin 0.8 0.2 - 1.2 mg/dL   Alkaline Phosphatase 47 39 - 117 U/L   AST 17 0 - 37 U/L   ALT 13 0 - 35 U/L   Total Protein 7.6 6.0 - 8.3 g/dL   Albumin 4.6 3.5 - 5.2 g/dL   GFR 95.51 >60.00 mL/min   Calcium 9.4 8.4 - 10.5 mg/dL  CBC with Differential/Platelet  Result Value Ref Range   WBC 5.3 4.0 - 10.5 K/uL   RBC 4.26 3.87 - 5.11 Mil/uL   Hemoglobin 13.9 12.0 - 15.0 g/dL   HCT 40.1 36.0 - 46.0 %   MCV 94.1 78.0 - 100.0 fl   MCHC 34.6 30.0 - 36.0 g/dL   RDW 13.2 11.5 - 15.5 %   Platelets 223.0 150.0 - 400.0 K/uL   Neutrophils Relative % 50.8 43.0 - 77.0 %   Lymphocytes Relative 38.7 12.0 - 46.0 %   Monocytes Relative 8.7 3.0 - 12.0 %   Eosinophils Relative 1.1 0.0 - 5.0 %   Basophils Relative 0.7 0.0 - 3.0 %   Neutro Abs 2.7 1.4 - 7.7 K/uL   Lymphs Abs 2.1 0.7 - 4.0 K/uL   Monocytes Absolute 0.5 0.1 - 1.0 K/uL   Eosinophils Absolute 0.1 0.0 - 0.7 K/uL  Basophils Absolute 0.0 0.0 - 0.1 K/uL    IMMUNIZATIONS:   Flu: Flu vaccine completed elsewhere this season Prevnar 13: Prevnar 13 N/A for this patient Pneumovax 20: Pneumovax 20 N/A for this patient Vac Shingrix: Shingrix declined, patient will complete at a later date HPV: HPV N/A for this patient Tetanus: Tetanus  completed in the last 10 years  HEALTH MAINTENANCE: Pap Smear HM Status: is not applicable for this patient Mammogram HM Status: is due and to be scheduled by patient for later completion Colon Cancer Screening HM Status: is up to date Bone Density HM Status: is up to date STI Testing HM Status: is not applicable for this patient  Eye Exam HM Status: is up to date Urine Micro HM Status: is not applicable for this patient  Spirometry HM Status: is not applicable for this patient      Assessment & Plan:   Problem List Items Addressed This Visit     B12 deficiency - Primary    Chronic. Reviewed most recent labs from GI. Levels good at this time. Will continue to monitor.       Relevant Orders   Lipid panel   Vitamin D deficiency, unspecified    Chronic. Labs reviewed from recent GI visit. Vitamin D levels are appropriate at this time. Continue current plan.       Relevant Orders   Lipid panel   General medical examination    CPE today with no abnormalities noted on exam.  Labs pending. Will make changes as necessary based on results.  Review of HM activities and recommendations discussed and provided on AVS Anticipatory guidance, diet, and exercise recommendations provided.  Medications, allergies, and hx reviewed and updated as necessary.  Plan to f/u with CPE in 1 year or sooner for acute/chronic health needs as directed.        Relevant Orders   Lipid panel   Gastroesophageal reflux disease with esophagitis without hemorrhage    Alternating pantoprazole '40mg'$  and '20mg'$  to help control symptoms. Refills on '20mg'$  provided today. Followed by GI. No alarm sx present at this time. Labs from GI reviewed today. OK to send refills as needed for 1 year.       Relevant Medications   pantoprazole (PROTONIX) 20 MG tablet   Hypercholesterolemia    Chronic. Will obtain labs today for evaluation.       Relevant Orders   Lipid panel   Anal fissure    Currently on topical treatment  from GI. Symptoms still present. She does have a follow-up scheduled for evaluation in the near future. Recommend continue medication and keep follow-up for repeat evaluation and recommendations.       Screening mammogram for breast cancer   Relevant Orders   MM 3D SCREEN BREAST BILATERAL   Chronic right shoulder pain    Right shoulder pain with certain movements. Nature if fluctuating with periods of improvement. Recommend she continue with at home exercises. If this is still bothering her we can send referral for ortho for evaluation.       Need for vaccination against Streptococcus pneumoniae   Relevant Orders   Pneumococcal conjugate vaccine 20-valent (Prevnar 20) (Completed)   Vaginal atrophy    She has been using aquaphor, which does help, but still having discomfort. Not sexually active. At this time I feel that topical estrogen in small amount is reasonable for her to try. Discussed trying the smallest amount possible to help with symptom management. Follow-up if no improvement.  Relevant Medications   conjugated estrogens (PREMARIN) vaginal cream   Pain of right heel    Symptoms consistent with plantar fasciitis or possible heel spur. Recommendations for home stretches, icing, and bracing provided for patient. Shoes are appropriate. If no improvement with symptoms we will plan to refer to podiatry for evaluation.            Follow up plan: Return in about 1 year (around 09/05/2023) for CPE.  NEXT PREVENTATIVE PHYSICAL DUE IN 1 YEAR.  PATIENT COUNSELING PROVIDED FOR ALL ADULT PATIENTS:  Consume a well balanced diet low in saturated fats, cholesterol, and moderation in carbohydrates.   This can be as simple as monitoring portion sizes and cutting back on sugary beverages such as soda and juice to start with.    Daily water consumption of at least 64 ounces.  Physical activity at least 180 minutes per week, if just starting out.   This can be as simple as taking the  stairs instead of the elevator and walking 2-3 laps around the office  purposefully every day.   STD protection, partner selection, and regular testing if high risk.  Limited consumption of alcoholic beverages if alcohol is consumed.  For women, I recommend no more than 7 alcoholic beverages per week, spread out throughout the week.  Avoid "binge" drinking or consuming large quantities of alcohol in one setting.   Please let me know if you feel you may need help with reduction or quitting alcohol consumption.   Avoidance of nicotine, if used.  Please let me know if you feel you may need help with reduction or quitting nicotine use.   Daily mental health attention.  This can be in the form of 5 minute daily meditation, prayer, journaling, yoga, reflection, etc.   Purposeful attention to your emotions and mental state can significantly improve your overall wellbeing and Health.  Please know that I am here to help you with all of your health care goals and am happy to work with you to find a solution that works best for you.  The greatest advice I have received with any changes in life are to take it one step at a time, that even means if all you can focus on is the next 60 seconds, then do that and celebrate your victories.  With any changes in life, you will have set backs, and that is OK. The important thing to remember is, if you have a set back, it is not a failure, it is an opportunity to try again!  Health Maintenance Recommendations Screening Testing Mammogram Every 1 -2 years based on history and risk factors Starting at age 78 Pap Smear Ages 21-39 every 3 years Ages 60-65 every 5 years with HPV testing More frequent testing may be required based on results and history Colon Cancer Screening Every 1-10 years based on test performed, risk factors, and history Starting at age 18 Bone Density Screening Every 2-10 years based on history Starting at age 26 for  women Recommendations for men differ based on medication usage, history, and risk factors AAA Screening One time ultrasound Men 70-40 years old who have every smoked Lung Cancer Screening Low Dose Lung CT every 12 months Age 32-80 years with a 30 pack-year smoking history who still smoke or who have quit within the last 15 years  Screening Labs Routine  Labs: Complete Blood Count (CBC), Complete Metabolic Panel (CMP), Cholesterol (Lipid Panel) Every 6-12 months based on history and medications May be  recommended more frequently based on current conditions or previous results Hemoglobin A1c Lab Every 3-12 months based on history and previous results Starting at age 33 or earlier with diagnosis of diabetes, high cholesterol, BMI >26, and/or risk factors Frequent monitoring for patients with diabetes to ensure blood sugar control Thyroid Panel (TSH w/ T3 & T4) Every 6 months based on history, symptoms, and risk factors May be repeated more often if on medication HIV One time testing for all patients 90 and older May be repeated more frequently for patients with increased risk factors or exposure Hepatitis C One time testing for all patients 12 and older May be repeated more frequently for patients with increased risk factors or exposure Gonorrhea, Chlamydia Every 12 months for all sexually active persons 13-24 years Additional monitoring may be recommended for those who are considered high risk or who have symptoms PSA Men 37-72 years old with risk factors Additional screening may be recommended from age 47-69 based on risk factors, symptoms, and history  Vaccine Recommendations Tetanus Booster All adults every 10 years Flu Vaccine All patients 6 months and older every year COVID Vaccine All patients 12 years and older Initial dosing with booster May recommend additional booster based on age and health history HPV Vaccine 2 doses all patients age 7-26 Dosing may be considered  for patients over 26 Shingles Vaccine (Shingrix) 2 doses all adults 32 years and older Pneumonia (Pneumovax 23) All adults 82 years and older May recommend earlier dosing based on health history Pneumonia (Prevnar 7) All adults 58 years and older Dosed 1 year after Pneumovax 23  Additional Screening, Testing, and Vaccinations may be recommended on an individualized basis based on family history, health history, risk factors, and/or exposure.

## 2022-09-04 NOTE — Assessment & Plan Note (Signed)
Symptoms consistent with plantar fasciitis or possible heel spur. Recommendations for home stretches, icing, and bracing provided for patient. Shoes are appropriate. If no improvement with symptoms we will plan to refer to podiatry for evaluation.

## 2022-09-04 NOTE — Assessment & Plan Note (Signed)
Alternating pantoprazole '40mg'$  and '20mg'$  to help control symptoms. Refills on '20mg'$  provided today. Followed by GI. No alarm sx present at this time. Labs from GI reviewed today. OK to send refills as needed for 1 year.

## 2022-09-04 NOTE — Assessment & Plan Note (Signed)
She has been using aquaphor, which does help, but still having discomfort. Not sexually active. At this time I feel that topical estrogen in small amount is reasonable for her to try. Discussed trying the smallest amount possible to help with symptom management. Follow-up if no improvement.

## 2022-09-04 NOTE — Assessment & Plan Note (Signed)
Chronic. Will obtain labs today for evaluation.

## 2022-09-04 NOTE — Assessment & Plan Note (Signed)
Currently on topical treatment from GI. Symptoms still present. She does have a follow-up scheduled for evaluation in the near future. Recommend continue medication and keep follow-up for repeat evaluation and recommendations.

## 2022-09-04 NOTE — Assessment & Plan Note (Signed)
Chronic. Labs reviewed from recent GI visit. Vitamin D levels are appropriate at this time. Continue current plan.

## 2022-09-04 NOTE — Assessment & Plan Note (Signed)

## 2022-09-05 LAB — LIPID PANEL
Chol/HDL Ratio: 2.5 ratio (ref 0.0–4.4)
Cholesterol, Total: 219 mg/dL — ABNORMAL HIGH (ref 100–199)
HDL: 89 mg/dL (ref >39)
LDL Chol Calc (NIH): 120 mg/dL — ABNORMAL HIGH (ref 0–99)
Triglycerides: 57 mg/dL (ref 0–149)
VLDL Cholesterol Cal: 10 mg/dL (ref 5–40)

## 2022-09-14 ENCOUNTER — Encounter: Payer: Self-pay | Admitting: Nurse Practitioner

## 2022-09-14 ENCOUNTER — Ambulatory Visit (INDEPENDENT_AMBULATORY_CARE_PROVIDER_SITE_OTHER): Payer: Medicare Other | Admitting: Nurse Practitioner

## 2022-09-14 VITALS — BP 128/82 | HR 84 | Temp 98.3°F | Wt 129.4 lb

## 2022-09-14 DIAGNOSIS — R1032 Left lower quadrant pain: Secondary | ICD-10-CM | POA: Diagnosis not present

## 2022-09-14 DIAGNOSIS — M545 Low back pain, unspecified: Secondary | ICD-10-CM | POA: Diagnosis not present

## 2022-09-14 HISTORY — DX: Left lower quadrant pain: R10.32

## 2022-09-14 MED ORDER — CYCLOBENZAPRINE HCL 5 MG PO TABS: 5.0000 mg | ORAL_TABLET | Freq: Every day | ORAL | 1 refills | Status: DC

## 2022-09-14 MED ORDER — MELOXICAM 7.5 MG PO TABS: 7.5000 mg | ORAL_TABLET | Freq: Every day | ORAL | 0 refills | Status: DC

## 2022-09-14 NOTE — Assessment & Plan Note (Signed)
Suspect pain related to diverticulosis. No alarm sx present at this time. Urine clear. Recommend liquid diet for now to allow for bowel rest. If new or worsening sx develop, recommend f/u for imaging. She would like to wait to see GI if possible.

## 2022-09-14 NOTE — Patient Instructions (Addendum)
Let's try to see if gentle stretching, ice, heat, and muscle relaxer seems to help over the next couple of days. If this does not seem to help then we can always get an ultrasound to see if this is something else going on.   Ice or heat for 20 minutes at a time several times a day can be very helpful.   Keep up the soup diet for now. This will definitely help if it is diverticulosis that is triggering the belly pain.

## 2022-09-14 NOTE — Progress Notes (Signed)
[name], DNP, AGNP-c [facility] [address], [place] [address] [phone]  Subjective:   Jaclyn Spencer is a 66 y.o. female presents to day for evaluation of: Back Pain Feels like it comes in waves, has been ongoing for the past couple of weeks Located on the left lower back spreading across the back.  She had a pneumonia vaccine the same day and initially she thought it came from the aches related to this or that she may have moved the wrong way.  She then started to get llq tenderness and pain in a pinpoint location. She tells me this is a chronic intermittent pain and she is not completely sure if this is related to the back pain.  It does seem to be getting better She is going to [place] right after Christmas and wanted to make sure she does not have anything significant going on.  She has a GI appt 01/10. She does have a history of diverticulosis.  She denies fever, blood in stool, severe pain, N/V/D.  PMH, Medications, and Allergies reviewed and updated in chart as appropriate.   ROS negative except for what is listed in HPI. Objective:  BP 128/82   Pulse 84   Temp 98.3 F (36.8 C)   Wt 129 lb 6.4 oz (58.7 kg)   BMI 21.53 kg/m  Physical Exam Vitals and nursing note reviewed.  Constitutional:      Appearance: Normal appearance. She is normal weight.  HENT:     Head: Normocephalic.  Eyes:     Pupils: Pupils are equal, round, and reactive to light.  Neck:     Vascular: No carotid bruit.  Cardiovascular:     Rate and Rhythm: Normal rate and regular rhythm.     Pulses: Normal pulses.     Heart sounds: Normal heart sounds.  Pulmonary:     Effort: Pulmonary effort is normal.     Breath sounds: Normal breath sounds.  Abdominal:     General: Abdomen is flat. Bowel sounds are normal. There is no distension.     Palpations: Abdomen is soft.     Tenderness: There is abdominal tenderness. There is no right CVA tenderness, left CVA  tenderness, guarding or rebound.    Musculoskeletal:        General: Tenderness present. Normal range of motion.     Cervical back: Normal range of motion.     Comments: Lumbar region- worse on left. No radiation.  Lymphadenopathy:     Cervical: No cervical adenopathy.  Skin:    General: Skin is warm and dry.     Capillary Refill: Capillary refill takes less than 2 seconds.  Neurological:     General: No focal deficit present.     Mental Status: She is alert and oriented to person, place, and time.  Psychiatric:        Mood and Affect: Mood normal.        Behavior: Behavior normal.           Assessment & Plan:   Problem List Items Addressed This Visit     Acute left-sided low back pain without sciatica - Primary    Left sided lumbaosacral pain appears to be MSK in nature. Her UA is negative today. Recommend gentle stretching exercises, meloxicam, and PRN flexeril at bedtime. Encouraged ice and heat, as well. If no improvement in the next couple of days she will let me know.      Relevant Medications  cyclobenzaprine (FLEXERIL) 5 MG tablet   meloxicam (MOBIC) 7.5 MG tablet   LLQ abdominal pain    Suspect pain related to diverticulosis. No alarm sx present at this time. Urine clear. Recommend liquid diet for now to allow for bowel rest. If new or worsening sx develop, recommend f/u for imaging. She would like to wait to see GI if possible.          [name], DNP, AGNP-c 09/14/2022  12:57 PM    History, Medications, Surgery, SDOH, and Family History reviewed and updated as appropriate.

## 2022-09-14 NOTE — Assessment & Plan Note (Signed)
Left sided lumbaosacral pain appears to be MSK in nature. Her UA is negative today. Recommend gentle stretching exercises, meloxicam, and PRN flexeril at bedtime. Encouraged ice and heat, as well. If no improvement in the next couple of days she will let me know.

## 2022-09-29 NOTE — Progress Notes (Deleted)
09/29/2022 Jaclyn Spencer [id number] April 08, 1956  Referring provider: [name], NP Primary GI doctor: [name]  ASSESSMENT AND PLAN:    History of Present Illness:  67 y.o. female  with a past medical history of GERD with esophageal dysphagia, constipation, vitamin D deficiency, B12 deficiency and others listed below, returns to clinic today for follow up of GERD and hemorrhoids, last seen 06/22/22 with me.  10/10/20: EGD Upper esophageal sphincter spasm, unable to intubate esophagus 10/10/20: Colonoscopy 14 mm polyp removed, 82m polyp , diverticulosis and hemorrhoids. 10/21/2020 barium esophagram Prominent cricopharyngeal impression on posterior cervical esophagus during swallowing, 13 mm barium tablet became stuck at this level, passed after multiple large swallows of water.  No Zenker's diverticulum Cricopharyngeal achalasia 11/2020 EGD 2 cm hiatal hernia, Hill grade 3, LA grade B esophagitis, normal stomach, unremarkable biopsies. 02/17/2021 office visit with Dr. NSilverio Decampfor dysphagia and need on medication, consider MRI neck to look for osteophyte.  Patient at the time was concerned with PPI vitamin deficiency and long-term use.  Long discussion and check labs previously that showed normal vitamin D and B12. Discussed doing trial of PPI once daily while traveling and then Pepcid 40 mg once or twice daily when she returns. On examination patient had anterior rectal fissure given diltiazem/lidocaine discussed Benefiber sent/squatty potty and follow-up.   She  reports that she quit smoking about 46 years ago. Her smoking use included cigarettes. She has never used smokeless tobacco. She reports current alcohol use. She reports that she does not use drugs. Her family history includes Arthritis in her sister; Cancer in her brother; Colitis in her mother; Coronary artery disease in her paternal grandfather; Coronary artery disease (age of onset: 785 in her mother; Heart attack in her  mother; Inflammatory bowel disease in her mother; Kidney cancer in her mother; Lung cancer in her father; Stomach cancer in her paternal aunt and paternal grandmother.   Current Medications:     Current Outpatient Medications (Respiratory):    loratadine (CLARITIN) 10 MG tablet, Take 1 tablet (10 mg total) by mouth daily.  Current Outpatient Medications (Analgesics):    meloxicam (MOBIC) 7.5 MG tablet, Take 1 tablet (7.5 mg total) by mouth daily.   Current Outpatient Medications (Other):    conjugated estrogens (PREMARIN) vaginal cream, Place 1 Applicatorful vaginally daily.   cyclobenzaprine (FLEXERIL) 5 MG tablet, Take 1 tablet (5 mg total) by mouth at bedtime.   hydrocortisone (ANUSOL-HC) 2.5 % rectal cream, Place 1 application rectally at bedtime as needed for hemorrhoids or anal itching.   Multiple Vitamins-Minerals (MULTIVITAMIN WITH MINERALS) tablet, Take 1 tablet by mouth 3 (three) times a week.   NON FORMULARY, Diltiazem 2%/Lidocaine5% compound Use 3 x rectally daily for 2 months to heal anal fissure   pantoprazole (PROTONIX) 20 MG tablet, Alternate one tab (251m with separate prescription for 4094ms directed.   pantoprazole (PROTONIX) 40 MG tablet, Take 1 tablet (40 mg total) by mouth daily.  Surgical History:  She  has a past surgical history that includes Mohs surgery and Esophagogastroduodenoscopy (egd) with propofol (N/A, 12/03/2020).  Current Medications, Allergies, Past Medical History, Past Surgical History, Family History and Social History were reviewed in [employer].  Physical Exam: There were no vitals taken for this visit. General:   Pleasant, well developed female in no acute distress Heart : Regular rate and rhythm; no murmurs Pulm: Clear anteriorly; no wheezing Abdomen:  Soft, Flat AB, Active bowel sounds. No tenderness . ,  No organomegaly appreciated. Rectal: anterior fissure noted, normal rectal tone, internal hemorrhoids  appreciated, no masses, non tender, brown stool, hemoccult negative Extremities:  without  edema. Neurologic:  Alert and  oriented x4;  No focal deficits.  Psych:  Cooperative. Normal mood and affect.   Vladimir Crofts, PA-C 09/29/22

## 2022-10-05 ENCOUNTER — Telehealth (INDEPENDENT_AMBULATORY_CARE_PROVIDER_SITE_OTHER): Payer: Medicare Other | Admitting: Nurse Practitioner

## 2022-10-05 ENCOUNTER — Encounter: Payer: Self-pay | Admitting: Nurse Practitioner

## 2022-10-05 VITALS — Temp 97.4°F | Ht 66.0 in | Wt 129.0 lb

## 2022-10-05 DIAGNOSIS — U071 COVID-19: Secondary | ICD-10-CM

## 2022-10-05 HISTORY — DX: COVID-19: U07.1

## 2022-10-05 MED ORDER — NIRMATRELVIR/RITONAVIR (PAXLOVID)TABLET: 3.0000 | ORAL_TABLET | Freq: Two times a day (BID) | ORAL | 0 refills | Status: AC

## 2022-10-05 NOTE — Assessment & Plan Note (Signed)
COVID positive with symptoms. Given age, patient is considered in high risk category and I do recommend antiviral use to help reduce the risk of serious illness and long term sequela. Patient in agreement to plan. Paxlovid sent to pharmacy. Recommendations for supportive therapy discussed with patient. She is aware to follow-up if she has any severe symptoms or new episodes of palpitations.

## 2022-10-05 NOTE — Progress Notes (Signed)
Virtual Visit Encounter mychart visit.   I connected with  Jaclyn Spencer on 10/05/22 at 10:30 AM EST by secure video and audio telemedicine application. I verified that I am speaking with the correct person using two identifiers.   I introduced myself as a Designer, jewellery with the practice. The limitations of evaluation and management by telemedicine discussed with the patient and the availability of in person appointments. The patient expressed verbal understanding and consent to proceed.  Participating parties in this visit include: Myself and patient  The patient is: Patient Location: Home I am: Provider Location: Office/Clinic Subjective:    CC and HPI: Jaclyn Spencer is a 67 y.o. year old female presenting for new evaluation and treatment of [place]. Patient reports the following: She tells me she started feeling bad on Saturday and tested positive for COVID the same day. Her worst symptom is fatigue. She tells me that she has had two very brief episodes of feeling like her heart is beating rapidly, but these resolved quickly and she denies any dizziness, shortness of breath, weakness, or other symptoms during these periods.  She is taking Tylenol, vitamin C, zinc, and herbal tea, which are somewhat helpful for her symptoms. Her husband is COVID positive and is on paxlovid.   Past medical history, Surgical history, Family history not pertinant except as noted below, Social history, Allergies, and medications have been entered into the medical record, reviewed, and corrections made.   Review of Systems:  All review of systems negative except what is listed in the HPI  Objective:    Alert and oriented x 4 Audibly congested Speaking in clear sentences with no shortness of breath. No distress.  Impression and Recommendations:    Problem List Items Addressed This Visit     COVID-19 - Primary    COVID positive with symptoms. Given age, patient is considered in high risk category and I  do recommend antiviral use to help reduce the risk of serious illness and long term sequela. Patient in agreement to plan. Paxlovid sent to pharmacy. Recommendations for supportive therapy discussed with patient. She is aware to follow-up if she has any severe symptoms or new episodes of palpitations.       Relevant Medications   nirmatrelvir/ritonavir (PAXLOVID) 20 x 150 MG & 10 x '100MG'$  TABS    orders and follow up as documented in EMR I discussed the assessment and treatment plan with the patient. The patient was provided an opportunity to ask questions and all were answered. The patient agreed with the plan and demonstrated an understanding of the instructions.   The patient was advised to call back or seek an in-person evaluation if the symptoms worsen or if the condition fails to improve as anticipated.  Follow-Up: prn  I provided 18 minutes of non-face-to-face interaction with this non face-to-face encounter including intake, same-day documentation, and chart review.   [name], NP , DNP, AGNP-c [facility]

## 2022-10-07 ENCOUNTER — Ambulatory Visit: Payer: Medicare Other | Admitting: Physician Assistant

## 2022-10-07 DIAGNOSIS — K219 Gastro-esophageal reflux disease without esophagitis: Secondary | ICD-10-CM

## 2022-10-07 DIAGNOSIS — K449 Diaphragmatic hernia without obstruction or gangrene: Secondary | ICD-10-CM

## 2022-10-07 DIAGNOSIS — D120 Benign neoplasm of cecum: Secondary | ICD-10-CM

## 2022-10-09 ENCOUNTER — Telehealth: Payer: Self-pay | Admitting: Nurse Practitioner

## 2022-10-09 MED ORDER — AMOXICILLIN 875 MG PO TABS: 875.0000 mg | ORAL_TABLET | Freq: Two times a day (BID) | ORAL | 0 refills | Status: AC

## 2022-10-09 NOTE — Telephone Encounter (Signed)
Pt called and states that is starting to get a sinus headache and is having some thick yellow mucos she is wanting to know if there is something else she can get or take to go with that  Pt uses  [facility] # [place], [place]

## 2022-10-15 NOTE — Progress Notes (Signed)
10/16/2022 Jaclyn Spencer [id number] 1956/03/14  Referring provider: [name], NP Primary GI doctor: [name]  ASSESSMENT AND PLAN:   Intermittent rectal pain with hemorrhoids and anal fissure On rectal exam worsening hemorrhoids with anterior fissure likely causing the pain, will treat with diltiazem/lidocaine, hydrocortisone suppositories and schedule close follow up with [name] for evaluation Discussed possible sooner repeat colonoscopy, patient prefers to see [name] first.  May benefit from repeat colon, hemorrhoid banding, or surgical referral for fissure if not improving.   History of adenomatous polyps  10/10/20: Colonoscopy 14 mm polyp removed, 31m polyp  Recall 09/2023   Gastroesophageal reflux disease with esophagitis without hemorrhage Lifestyle changes discussed, avoid NSAIDS, ETOH Continue on the same medication, reports symptoms are well controlled.     History of Present Illness:  67y.o. female  with a past medical history of GERD with esophageal dysphagia, constipation, vitamin D deficiency, B12 deficiency and others listed below, returns to clinic today for follow up of GERD and hemorrhoids, last seen 06/22/22 with me.  10/10/20: EGD Upper esophageal sphincter spasm, unable to intubate esophagus 10/10/20: Colonoscopy 14 mm polyp removed, 214mpolyp , diverticulosis and hemorrhoids.Recall 3 years ( 10/11/2023) 10/21/2020 barium esophagram Prominent cricopharyngeal impression on posterior cervical esophagus during swallowing, 13 mm barium tablet became stuck at this level, passed after multiple large swallows of water.  No Zenker's diverticulum Cricopharyngeal achalasia 11/2020 EGD 2 cm hiatal hernia, Hill grade 3, LA grade B esophagitis, normal stomach, unremarkable biopsies. 02/17/2021 office visit with Dr. NaSilverio Decampor dysphagia and need on medication, consider MRI neck to look for osteophyte.  Patient at the time was concerned with PPI vitamin  deficiency and long-term use.  Long discussion and check labs previously that showed normal vitamin D and B12. Discussed doing trial of PPI once daily while traveling and then Pepcid 40 mg once or twice daily when she returns. She is on prilosec 20 mg every day while traveling, [place], she is doign the prilosec every other other day with pepcid as needed.  Treated anterior fissure last visit.  Has been having regular/consistent Bm's, no constipation, straining, diarrhea.  While she was traveling she was not bothered by her fissure but since she has gotten back in the last 2 weeks, she has had a raw feeling with her rectum.  She will have deep dull intense anal pain, can wake her up at night, will pass in 30 mins, can feel she will need to go to the bathroom when it happens but unable to pass any stool. Happens 2-3 x a month. Did not happen while traveling or after treating fissure but has happened once within last 2 weeks.  Denies melena or hematochezia.  The diliazem/lidocaine resolved it previously.  Never added fiber or squatty potty.  She states with her diet she gets a lot of fiber.    She  reports that she quit smoking about 46 years ago. Her smoking use included cigarettes. She has never used smokeless tobacco. She reports current alcohol use. She reports that she does not use drugs. Her family history includes Arthritis in her sister; Cancer in her brother; Colitis in her mother; Coronary artery disease in her paternal grandfather; Coronary artery disease (age of onset: 7028in her mother; Heart attack in her mother; Inflammatory bowel disease in her mother; Kidney cancer in her mother; Lung cancer in her father; Stomach cancer in her paternal aunt and  paternal grandmother.   Current Medications:     Current Outpatient Medications (Respiratory):    loratadine (CLARITIN) 10 MG tablet, Take 1 tablet (10 mg total) by mouth daily.  Current  Outpatient Medications (Analgesics):    meloxicam (MOBIC) 7.5 MG tablet, Take 1 tablet (7.5 mg total) by mouth daily.   Current Outpatient Medications (Other):    conjugated estrogens (PREMARIN) vaginal cream, Place 1 Applicatorful vaginally daily.   cyclobenzaprine (FLEXERIL) 5 MG tablet, Take 1 tablet (5 mg total) by mouth at bedtime.   hydrocortisone (ANUSOL-HC) 25 MG suppository, Place 1 suppository (25 mg total) rectally 2 (two) times daily.   Multiple Vitamins-Minerals (MULTIVITAMIN WITH MINERALS) tablet, Take 1 tablet by mouth 3 (three) times a week.   NON FORMULARY, Diltiazem 2%/Lidocaine5% compound Use 3 x rectally daily for 2 months to heal anal fissure   pantoprazole (PROTONIX) 20 MG tablet, Alternate one tab ('20mg'$ ) with separate prescription for '40mg'$  as directed.   pantoprazole (PROTONIX) 40 MG tablet, Take 1 tablet (40 mg total) by mouth daily.   hydrocortisone (ANUSOL-HC) 2.5 % rectal cream, Place 1 Application rectally at bedtime as needed for hemorrhoids or anal itching.  Surgical History:  She  has a past surgical history that includes Mohs surgery and Esophagogastroduodenoscopy (egd) with propofol (N/A, 12/03/2020).  Current Medications, Allergies, Past Medical History, Past Surgical History, Family History and Social History were reviewed in [employer] record.  Physical Exam: BP 102/60   Pulse 83   Ht '5\' 6"'$  (1.676 m)   Wt 128 lb (58.1 kg)   BMI 20.66 kg/m  General:   Pleasant, well developed female in no acute distress Heart : Regular rate and rhythm; no murmurs Pulm: Clear anteriorly; no wheezing Abdomen:  Soft, Flat AB, Active bowel sounds. No tenderness . , No organomegaly appreciated. Rectal: anterior fissure noted with worsening erythema and possible prolapsed/inflammed internal hemorrhoids, normal rectal tone, internal hemorrhoids appreciated and slightly more stenotic then previous examination, no masses, non tender, brown stool, hemoccult  negative Extremities:  without  edema. Neurologic:  Alert and  oriented x4;  No focal deficits.  Psych:  Cooperative. Normal mood and affect.   Vladimir Crofts, PA-C 10/16/22

## 2022-10-16 ENCOUNTER — Encounter: Payer: Self-pay | Admitting: Physician Assistant

## 2022-10-16 ENCOUNTER — Ambulatory Visit (INDEPENDENT_AMBULATORY_CARE_PROVIDER_SITE_OTHER): Payer: Medicare Other | Admitting: Physician Assistant

## 2022-10-16 VITALS — BP 102/60 | HR 83 | Ht 66.0 in | Wt 128.0 lb

## 2022-10-16 DIAGNOSIS — K6289 Other specified diseases of anus and rectum: Secondary | ICD-10-CM | POA: Diagnosis not present

## 2022-10-16 DIAGNOSIS — Z86010 Personal history of colonic polyps: Secondary | ICD-10-CM | POA: Diagnosis not present

## 2022-10-16 DIAGNOSIS — K641 Second degree hemorrhoids: Secondary | ICD-10-CM | POA: Diagnosis not present

## 2022-10-16 DIAGNOSIS — K602 Anal fissure, unspecified: Secondary | ICD-10-CM

## 2022-10-16 DIAGNOSIS — K2100 Gastro-esophageal reflux disease with esophagitis, without bleeding: Secondary | ICD-10-CM

## 2022-10-16 MED ORDER — HYDROCORTISONE ACETATE 25 MG RE SUPP: 25.0000 mg | Freq: Two times a day (BID) | RECTAL | 0 refills | Status: DC

## 2022-10-16 MED ORDER — HYDROCORTISONE (PERIANAL) 2.5 % EX CREA: 1.0000 | TOPICAL_CREAM | Freq: Every evening | CUTANEOUS | 1 refills | Status: DC | PRN

## 2022-10-16 NOTE — Patient Instructions (Addendum)
_______________________________________________________  If your blood pressure at your visit was 140/90 or greater, please contact your primary care physician to follow up on this.  _______________________________________________________  If you are age 67 or older, your body mass index should be between 23-30. Your Body mass index is 20.66 kg/m. If this is out of the aforementioned range listed, please consider follow up with your Primary Care Provider.  If you are age 34 or younger, your body mass index should be between 19-25. Your Body mass index is 20.66 kg/m. If this is out of the aformentioned range listed, please consider follow up with your Primary Care Provider.   ________________________________________________________  The [facility] providers would like to encourage you to use [facility] to communicate with providers for non-urgent requests or questions.  Due to long hold times on the telephone, sending your provider a message by [facility] may be a faster and more efficient way to get a response.  Please allow 48 business hours for a response.  Please remember that this is for non-urgent requests.  _______________________________________________________   Adriana Reams, Adult  Diltiazem/lidocaine 3 x daily for 2 months sent to compound pharmacy   Follow up should symptoms persist for secondary evaluation and possible surgical referral for repair.  Please do sitz baths- these can be found at the pharmacy. It is a English as a second language teacher that is put in your toliet.  Please increase fiber or add benefiber, increase water and increase acitivity.  Will send in hydrocoritsone suppository, cheapest with GOODRX from [employer], [employer], [employer] or [employer] if your insurance does not pay for it. If the hemorrhoid suppository sent in is too expensive you can do this over the counter trick.  Apply a pea size amount of over the counter Anusol HC cream to the tip of an over the counter PrepH  suppository and insert rectally once every night for at least 7 nights.  If this does not improve there are procedures that can be done.   About Hemorrhoids  Hemorrhoids are swollen veins in the lower rectum and anus.  Also called piles, hemorrhoids are a common problem.  Hemorrhoids may be internal (inside the rectum) or external (around the anus).  Internal Hemorrhoids  Internal hemorrhoids are often painless, but they rarely cause bleeding.  The internal veins may stretch and fall down (prolapse) through the anus to the outside of the body.  The veins may then become irritated and painful.  External Hemorrhoids  External hemorrhoids can be easily seen or felt around the anal opening.  They are under the skin around the anus.  When the swollen veins are scratched or broken by straining, rubbing or wiping they sometimes bleed.  How Hemorrhoids Occur  Veins in the rectum and around the anus tend to swell under pressure.  Hemorrhoids can result from increased pressure in the veins of your anus or rectum.  Some sources of pressure are:  Straining to have a bowel movement because of constipation Waiting too long to have a bowel movement Coughing and sneezing often Sitting for extended periods of time, including on the toilet Diarrhea Obesity Trauma or injury to the anus Some liver diseases Stress Family history of hemorrhoids Pregnancy  Pregnant women should try to avoid becoming constipated, because they are more likely to have hemorrhoids during pregnancy.  In the last trimester of pregnancy, the enlarged uterus may press on blood vessels and causes hemorrhoids.  In addition, the strain of childbirth sometimes causes hemorrhoids after the birth.  Symptoms of  Hemorrhoids  Some symptoms of hemorrhoids include: Swelling and/or a tender lump around the anus Itching, mild burning and bleeding around the anus Painful bowel movements with or without constipation Bright red blood  covering the stool, on toilet paper or in the toilet bowel.   Symptoms usually go away within a few days.  Always talk to your doctor about any bleeding to make sure it is not from some other causes.  Diagnosing and Treating Hemorrhoids  Diagnosis is made by an examination by your healthcare provider.  Special test can be performed by your doctor.    Most cases of hemorrhoids can be treated with: High-fiber diet: Eat more high-fiber foods, which help prevent constipation.  Ask for more detailed fiber information on types and sources of fiber from your healthcare provider. Fluids: Drink plenty of water.  This helps soften bowel movements so they are easier to pass. Sitz baths and cold packs: Sitting in lukewarm water two or three times a day for 15 minutes cleases the anal area and may relieve discomfort.  If the water is too hot, swelling around the anus will get worse.  Placing a cloth-covered ice pack on the anus for ten minutes four times a day can also help reduce selling.  Gently pushing a prolapsed hemorrhoid back inside after the bath or ice pack can be helpful. Medications: For mild discomfort, your healthcare provider may suggest over-the-counter pain medication or prescribe a cream or ointment for topical use.  The cream may contain witch hazel, zinc oxide or petroleum jelly.  Medicated suppositories are also a treatment option.  Always consult your doctor before applying medications or creams. Procedures and surgeries: There are also a number of procedures and surgeries to shrink or remove hemorrhoids in more serious cases.  Talk to your physician about these options.  You can often prevent hemorrhoids or keep them from becoming worse by maintaining a healthy lifestyle.  Eat a fiber-rich diet of fruits, vegetables and whole grains.  Also, drink plenty of water and exercise regularly.   2007, [employer] Doc.30

## 2022-11-19 ENCOUNTER — Encounter: Payer: Self-pay | Admitting: Nurse Practitioner

## 2022-11-19 ENCOUNTER — Telehealth (INDEPENDENT_AMBULATORY_CARE_PROVIDER_SITE_OTHER): Payer: Medicare Other | Admitting: Nurse Practitioner

## 2022-11-19 VITALS — BP 130/80 | HR 107 | Temp 97.2°F | Ht 66.0 in | Wt 125.0 lb

## 2022-11-19 DIAGNOSIS — K529 Noninfective gastroenteritis and colitis, unspecified: Secondary | ICD-10-CM | POA: Insufficient documentation

## 2022-11-19 HISTORY — DX: Noninfective gastroenteritis and colitis, unspecified: K52.9

## 2022-11-19 MED ORDER — PROMETHAZINE HCL 25 MG PO TABS: 25.0000 mg | ORAL_TABLET | Freq: Four times a day (QID) | ORAL | 3 refills | Status: DC | PRN

## 2022-11-19 MED ORDER — LOPERAMIDE HCL 2 MG PO TABS: ORAL_TABLET | ORAL | 0 refills | Status: AC

## 2022-11-19 MED ORDER — PEDIALYTE ADVANCED CARE PO SOLN: ORAL | 6 refills | Status: DC

## 2022-11-19 NOTE — Progress Notes (Signed)
Virtual Visit Encounter mychart visit.   I connected with  Jaclyn Spencer on 11/19/22 at  9:15 AM EST by secure video and audio telemedicine application. I verified that I am speaking with the correct person using two identifiers.   I introduced myself as a Designer, jewellery with the practice. The limitations of evaluation and management by telemedicine discussed with the patient and the availability of in person appointments. The patient expressed verbal understanding and consent to proceed.  Participating parties in this visit include: Myself and patient  The patient is: Patient Location: Home I am: Provider Location: Office/Clinic Subjective:    CC and HPI: Jaclyn Spencer is a 67 y.o. year old female presenting for new evaluation and treatment of vomiting and diarrhea. Patient reports the following: The patient presents today with concerns regarding gastrointestinal symptoms.  She reports a chief complaint of vomiting and diarrhea that began last night and continued through this morning. The patient describes ongoing nausea, however, she has been able to retain any liquids at present. She denies experiencing any dizziness, fever, or observing blood in her vomit or stool.  The patient expresses concern about the possibility of her husband contracting her illness. She also seeks dietary recommendations and exploring medication options to alleviate her symptoms.  Past medical history, Surgical history, Family history not pertinant except as noted below, Social history, Allergies, and medications have been entered into the medical record, reviewed, and corrections made.   Review of Systems:  All review of systems negative except what is listed in the HPI  Objective:    Alert and oriented x 4 Speaking in clear sentences with no shortness of breath. No distress.  Impression and Recommendations:    Problem List Items Addressed This Visit     Gastroenteritis - Primary    Patient presents  with vomiting and diarrhea, suggesting a viral etiology. Plan: - Encourage oral rehydration with clear liquids such as broth and Gatorade. - Advise following the BRAT diet (bananas, rice, applesauce, toast) upon tolerating liquids. - Avoid dairy products for at least 24 hours. - Emphasize hand hygiene and cleaning high-touch surfaces to prevent spread. - Prescribe Phenergan (promethazine) for symptomatic relief; instruct patient on as-needed use and caution regarding potential drowsiness. - Monitor for signs of dehydration, including dizziness, increased heart rate, or inability to keep liquids down. - Prescribe loperamide (Imodium) for symptomatic relief. - Advise patient to contact the clinic or visit the emergency room if symptoms worsen, fever develops, or inability to tolerate liquids occurs.      Relevant Medications   promethazine (PHENERGAN) 25 MG tablet   loperamide (IMODIUM A-D) 2 MG tablet   Oral Electrolytes (PEDIALYTE ADVANCED CARE) SOLN    orders and follow up as documented in EMR I discussed the assessment and treatment plan with the patient. The patient was provided an opportunity to ask questions and all were answered. The patient agreed with the plan and demonstrated an understanding of the instructions.   The patient was advised to call back or seek an in-person evaluation if the symptoms worsen or if the condition fails to improve as anticipated.  Follow-Up: prn  I provided 32 minutes of non-face-to-face interaction with this non face-to-face encounter including intake, same-day documentation, and chart review.   [name], NP , DNP, AGNP-c [facility]

## 2022-11-19 NOTE — Assessment & Plan Note (Signed)
Patient presents with vomiting and diarrhea, suggesting a viral etiology. Plan: - Encourage oral rehydration with clear liquids such as broth and Gatorade. - Advise following the BRAT diet (bananas, rice, applesauce, toast) upon tolerating liquids. - Avoid dairy products for at least 24 hours. - Emphasize hand hygiene and cleaning high-touch surfaces to prevent spread. - Prescribe Phenergan (promethazine) for symptomatic relief; instruct patient on as-needed use and caution regarding potential drowsiness. - Monitor for signs of dehydration, including dizziness, increased heart rate, or inability to keep liquids down. - Prescribe loperamide (Imodium) for symptomatic relief. - Advise patient to contact the clinic or visit the emergency room if symptoms worsen, fever develops, or inability to tolerate liquids occurs.

## 2022-11-30 NOTE — Progress Notes (Signed)
Jaclyn Spencer    [id number]    07/21/1956  Primary Care Physician:Early, Coralee Pesa, NP  Referring Physician: [name], NP [place],  [place] [address]   Chief complaint:  Dysphagia  HPI:  66 year old very pleasant female here for follow-up visit for dysphagia.   I last saw her on 02/17/21. At that time, she continued to have difficulty swallowing large pills and tough meat like steak. She had seen an ENT for cricopharyngeal achalasia.    Today,   GI Hx  DG Esophagus W Single CM 10/21/20 Cricopharyngeal achalasia   EGD December 03, 2020 - 2 cm hiatal hernia. - Gastroesophageal flap valve classified as Hill Grade III (minimal fold, loose to endoscope, hiatal hernia likely). - LA Grade B esophagitis with no bleeding. Biopsied. - Normal stomach. - Normal  Barium esophagram October 21, 2020: Prominent cricopharyngeal impression on posterior cervical esophagus during swallowing, 13 mm barium tablet became stuck at this level, passed after multiple large swallows of water.  No Zenker's diverticulum Cricopharyngeal achalasia   EGD 10/10/20: Upper esophageal sphincter spasm, unable to intubate esophagus   Colonoscopy 10/10/20: 14 mm polyp removed, 24m polyp , diverticulosis and hemorrhoids.     Denies any neck trauma.  She does not take any medications or herbal supplements. Denies any nausea, vomiting, abdominal pain, melena or bright red blood per rectum   Current Outpatient Medications:    conjugated estrogens (PREMARIN) vaginal cream, Place 1 Applicatorful vaginally daily., Disp: 42.5 g, Rfl: 12   cyclobenzaprine (FLEXERIL) 5 MG tablet, Take 1 tablet (5 mg total) by mouth at bedtime., Disp: 20 tablet, Rfl: 1   hydrocortisone (ANUSOL-HC) 2.5 % rectal cream, Place 1 Application rectally at bedtime as needed for hemorrhoids or anal itching., Disp: 30 g, Rfl: 1   hydrocortisone (ANUSOL-HC) 25 MG suppository, Place 1 suppository (25 mg total) rectally  2 (two) times daily., Disp: 12 suppository, Rfl: 0   loperamide (IMODIUM A-D) 2 MG tablet, Take 2 tablets at the first sign of diarrhea and 1 additional tablet with each additional stool. Max 8 pills in 24 hours., Disp: 30 tablet, Rfl: 0   loratadine (CLARITIN) 10 MG tablet, Take 1 tablet (10 mg total) by mouth daily., Disp: 30 tablet, Rfl: 11   meloxicam (MOBIC) 7.5 MG tablet, Take 1 tablet (7.5 mg total) by mouth daily., Disp: 30 tablet, Rfl: 0   Multiple Vitamins-Minerals (MULTIVITAMIN WITH MINERALS) tablet, Take 1 tablet by mouth 3 (three) times a week., Disp: , Rfl:    NON FORMULARY, Diltiazem 2%/Lidocaine5% compound Use 3 x rectally daily for 2 months to heal anal fissure, Disp: 30 g, Rfl: 1   Oral Electrolytes (PEDIALYTE ADVANCED CARE) SOLN, OPTIONAL: For rehydration. 1 ounce every 15 minutes following vomiting episode. If able to hold down for 1 hour, may slowly increase by 1-2 ounces every 30 minutes., Disp: 3000 mL, Rfl: 6   pantoprazole (PROTONIX) 20 MG tablet, Alternate one tab ('20mg'$ ) with separate prescription for '40mg'$  as directed., Disp: 90 tablet, Rfl: 1   pantoprazole (PROTONIX) 40 MG tablet, Take 1 tablet (40 mg total) by mouth daily., Disp: 90 tablet, Rfl: 3   promethazine (PHENERGAN) 25 MG tablet, Take 1 tablet (25 mg total) by mouth every 6 (six) hours as needed for nausea or vomiting., Disp: 30 tablet, Rfl: 3    Allergies as of 12/02/2022   (No Known Allergies)    Past Medical History:  Diagnosis Date  Abdominal pain    Abdominal pain, left upper quadrant 02/27/2010   Qualifier: Diagnosis of  By: Olevia Perches MD, Lowella Bandy    Abdominal pain, right lower quadrant 05/29/2010   Qualifier: Diagnosis of  By: Birdie Riddle MD, Belenda Cruise     Allergy    rhinitis   Anxiety    At high risk for tick borne illness 03/06/2022   B12 deficiency    Basal cell carcinoma    CHEST PAIN 05/29/2010   Qualifier: Diagnosis of  By: Birdie Riddle MD, Katherine     Diverticulosis    Encounter for screening  colonoscopy 07/16/2016   Epigastric pain 08/27/2011   GERD (gastroesophageal reflux disease)    Insomnia 07/16/2016   Left-sided muscle weakness 07/08/2021   Loss of weight 02/27/2010   Qualifier: Diagnosis of   By: Olevia Perches MD, Lowella Bandy      Nausea 03/06/2022   Shoulder pain 08/27/2011   Tick bite 03/08/2012   Vitamin D deficiency    Weight loss     Past Surgical History:  Procedure Laterality Date   ESOPHAGOGASTRODUODENOSCOPY (EGD) WITH PROPOFOL N/A 12/03/2020   Procedure: ESOPHAGOGASTRODUODENOSCOPY (EGD) WITH PROPOFOL;  Surgeon: Mauri Pole, MD;  Location: [facility];  Service: Endoscopy;  Laterality: N/A;   MOHS SURGERY     nose    Family History  Problem Relation Age of Onset   Colitis Mother    Inflammatory bowel disease Mother    Coronary artery disease Mother 77       Stents   Heart attack Mother    Kidney cancer Mother    Lung cancer Father    Coronary artery disease Paternal Grandfather    Stomach cancer Paternal Grandmother    Stomach cancer Paternal Aunt    Arthritis Sister    Cancer Brother        melanoma   Diabetes Neg Hx    Hyperlipidemia Neg Hx    Hypertension Neg Hx    Sudden death Neg Hx    Esophageal cancer Neg Hx    Pancreatic cancer Neg Hx    Liver disease Neg Hx    Colon cancer Neg Hx     Social History   Socioeconomic History   Marital status: Married    Spouse name: Not on file   Number of children: 0   Years of education: Not on file   Highest education level: Not on file  Occupational History   Not on file  Tobacco Use   Smoking status: Former    Types: Cigarettes    Quit date: 09/28/1976    Years since quitting: 46.2   Smokeless tobacco: Never  Vaping Use   Vaping Use: Never used  Substance and Sexual Activity   Alcohol use: Yes    Comment: occ   Drug use: No   Sexual activity: Not Currently  Other Topics Concern   Not on file  Social History Narrative   2 step daughters and 1 step son   Social Determinants of  Health   Financial Resource Strain: Not on file  Food Insecurity: Not on file  Transportation Needs: Not on file  Physical Activity: Not on file  Stress: Not on file  Social Connections: Not on file  Intimate Partner Violence: Not on file      Review of systems: Review of Systems    Physical Exam: General: well-appearing ***  Eyes: sclera anicteric, no redness ENT: oral mucosa moist without lesions, no cervical or supraclavicular lymphadenopathy CV: RRR, no JVD,  no peripheral edema Resp: clear to auscultation bilaterally, normal RR and effort noted GI: soft, no tenderness, with active bowel sounds. No guarding or palpable organomegaly noted. Skin; warm and dry, no rash or jaundice noted Neuro: awake, alert and oriented x 3. Normal gross motor function and fluent speech   Data Reviewed:  Reviewed labs, radiology imaging, old records and pertinent past GI work up   Assessment and Plan/Recommendations:  67 year old very pleasant female with dysphagia secondary to cricopharyngeal achalasia and GERD with erosive esophagitis  GERD with erosive esophagitis and hiatal hernia: Continue with lifestyle modifications and antireflux measures We will decrease pantoprazole to 20 mg daily before breakfast She mostly has nocturnal reflux, add Pepcid at bedtime as needed Use Gaviscon after meals for breakthrough heartburn or reflux symptoms  Cricopharyngeal achalasia: Symptoms are manageable we will continue to monitor We will plan for MRI neck at some point to exclude cervical osteophyte compressing on the esophagus  Return in 1 year or sooner if needed  The patient was provided an opportunity to ask questions and all were answered. The patient agreed with the plan and demonstrated an understanding of the instructions.     I,Safa M Kadhim,acting as a scribe for Harl Bowie, MD.,have documented all relevant documentation on the behalf of Harl Bowie, MD,as directed by   Harl Bowie, MD while in the presence of Harl Bowie, MD.   I, Harl Bowie, MD, have reviewed all documentation for this visit. The documentation on 11/30/22 for the exam, diagnosis, procedures, and orders are all accurate and complete.   Damaris Hippo , MD    CC: Early, Coralee Pesa, NP

## 2022-12-02 ENCOUNTER — Ambulatory Visit (INDEPENDENT_AMBULATORY_CARE_PROVIDER_SITE_OTHER): Payer: Medicare Other | Admitting: Gastroenterology

## 2022-12-02 ENCOUNTER — Encounter: Payer: Self-pay | Admitting: Gastroenterology

## 2022-12-02 VITALS — BP 110/70 | HR 95 | Ht 66.0 in | Wt 129.0 lb

## 2022-12-02 DIAGNOSIS — K602 Anal fissure, unspecified: Secondary | ICD-10-CM

## 2022-12-02 DIAGNOSIS — K2100 Gastro-esophageal reflux disease with esophagitis, without bleeding: Secondary | ICD-10-CM | POA: Diagnosis not present

## 2022-12-02 DIAGNOSIS — K641 Second degree hemorrhoids: Secondary | ICD-10-CM

## 2022-12-02 MED ORDER — NON FORMULARY: 1 refills | Status: DC

## 2022-12-02 NOTE — Patient Instructions (Addendum)
STOP Hydrocortisone   We have given you a new script of Nitroglycerin ointment  Take Benefiber 1 tablespoon twice a day  Follow up in 4 months  _______________________________________________________  If your blood pressure at your visit was 140/90 or greater, please contact your primary care physician to follow up on this.  _______________________________________________________  If you are age 67 or older, your body mass index should be between 23-30. Your Body mass index is 20.82 kg/m. If this is out of the aforementioned range listed, please consider follow up with your Primary Care Provider.  If you are age 53 or younger, your body mass index should be between 19-25. Your Body mass index is 20.82 kg/m. If this is out of the aformentioned range listed, please consider follow up with your Primary Care Provider.   ________________________________________________________  The [facility] providers would like to encourage you to use [facility] to communicate with providers for non-urgent requests or questions.  Due to long hold times on the telephone, sending your provider a message by [facility] may be a faster and more efficient way to get a response.  Please allow 48 business hours for a response.  Please remember that this is for non-urgent requests.  _______________________________________________________   I appreciate the  opportunity to care for you  Thank You   Harl Bowie , MD

## 2022-12-29 ENCOUNTER — Telehealth: Payer: Self-pay | Admitting: Nurse Practitioner

## 2022-12-29 NOTE — Telephone Encounter (Signed)
Contacted Jaclyn Spencer to schedule their annual wellness visit. Appointment made for 01/19/23.  [name]  [phone]

## 2023-01-19 ENCOUNTER — Telehealth: Payer: Self-pay

## 2023-01-19 ENCOUNTER — Ambulatory Visit: Payer: Medicare Other

## 2023-01-19 NOTE — Telephone Encounter (Signed)
This nurse attempted to call patient three times for AWV. Message left that we will call again to reschedule for another time. 

## 2023-04-07 NOTE — Progress Notes (Signed)
Jaclyn Spencer    [id number]    1956/07/20  Primary Care Physician:Early, Sung Amabile, NP  Referring Physician: Tollie Eth, NP [address] [place],  [place] [address]   Chief complaint:   No chief complaint on file.    HPI: 67 year old very pleasant female here for follow-up visit for anal fissure   I last saw her on 12/02/22. At that time, she was doing well overall and had been using nitroglycerin rectal cream once a day and hydrocortisone 25 mg once a day.  Today,   GI Hx  DG Esophagus W Single CM 10/21/20 Cricopharyngeal achalasia   EGD December 03, 2020 - 2 cm hiatal hernia. - Gastroesophageal flap valve classified as Hill Grade III (minimal fold, loose to endoscope, hiatal hernia likely). - LA Grade B esophagitis with no bleeding. Biopsied. - Normal stomach. - Normal  Barium esophagram October 21, 2020: Prominent cricopharyngeal impression on posterior cervical esophagus during swallowing, 13 mm barium tablet became stuck at this level, passed after multiple large swallows of water.  No Zenker's diverticulum Cricopharyngeal achalasia   EGD 10/10/20: Upper esophageal sphincter spasm, unable to intubate esophagus   Colonoscopy 10/10/20: 14 mm polyp removed, 2mm polyp , diverticulosis and hemorrhoids.     Denies any neck trauma.  She does not take any medications or herbal supplements. Denies any nausea, vomiting, abdominal pain, melena or bright red blood per rectum   Current Outpatient Medications:    conjugated estrogens (PREMARIN) vaginal cream, Place 1 Applicatorful vaginally daily., Disp: 42.5 g, Rfl: 12   cyclobenzaprine (FLEXERIL) 5 MG tablet, Take 1 tablet (5 mg total) by mouth at bedtime., Disp: 20 tablet, Rfl: 1   hydrocortisone (ANUSOL-HC) 2.5 % rectal cream, Place 1 Application rectally at bedtime as needed for hemorrhoids or anal itching., Disp: 30 g, Rfl: 1   hydrocortisone (ANUSOL-HC) 25 MG suppository, Place 1 suppository (25 mg total)  rectally 2 (two) times daily., Disp: 12 suppository, Rfl: 0   loperamide (IMODIUM A-D) 2 MG tablet, Take 2 tablets at the first sign of diarrhea and 1 additional tablet with each additional stool. Max 8 pills in 24 hours., Disp: 30 tablet, Rfl: 0   loratadine (CLARITIN) 10 MG tablet, Take 1 tablet (10 mg total) by mouth daily., Disp: 30 tablet, Rfl: 11   meloxicam (MOBIC) 7.5 MG tablet, Take 1 tablet (7.5 mg total) by mouth daily., Disp: 30 tablet, Rfl: 0   Multiple Vitamins-Minerals (MULTIVITAMIN WITH MINERALS) tablet, Take 1 tablet by mouth 3 (three) times a week., Disp: , Rfl:    NON FORMULARY, Diltiazem 2%/Lidocaine5% compound Use 3 x rectally daily for 2 months to heal anal fissure, Disp: 30 g, Rfl: 1   Oral Electrolytes (PEDIALYTE ADVANCED CARE) SOLN, OPTIONAL: For rehydration. 1 ounce every 15 minutes following vomiting episode. If able to hold down for 1 hour, may slowly increase by 1-2 ounces every 30 minutes., Disp: 3000 mL, Rfl: 6   pantoprazole (PROTONIX) 20 MG tablet, Alternate one tab (20mg ) with separate prescription for 40mg  as directed., Disp: 90 tablet, Rfl: 1   pantoprazole (PROTONIX) 40 MG tablet, Take 1 tablet (40 mg total) by mouth daily., Disp: 90 tablet, Rfl: 3   promethazine (PHENERGAN) 25 MG tablet, Take 1 tablet (25 mg total) by mouth every 6 (six) hours as needed for nausea or vomiting., Disp: 30 tablet, Rfl: 3    Allergies as of 04/08/2023   (No Known Allergies)    Past  Medical History:  Diagnosis Date   Abdominal pain    Abdominal pain, left upper quadrant 02/27/2010   Qualifier: Diagnosis of  By: Juanda Chance MD, Hedwig Morton    Abdominal pain, right lower quadrant 05/29/2010   Qualifier: Diagnosis of  By: Beverely Low MD, Natalia Leatherwood     Allergy    rhinitis   Anxiety    At high risk for tick borne illness 03/06/2022   B12 deficiency    Basal cell carcinoma    CHEST PAIN 05/29/2010   Qualifier: Diagnosis of  By: Beverely Low MD, Katherine     Diverticulosis    Encounter for  screening colonoscopy 07/16/2016   Epigastric pain 08/27/2011   GERD (gastroesophageal reflux disease)    Insomnia 07/16/2016   Left-sided muscle weakness 07/08/2021   Loss of weight 02/27/2010   Qualifier: Diagnosis of   By: Juanda Chance MD, Hedwig Morton      Nausea 03/06/2022   Shoulder pain 08/27/2011   Tick bite 03/08/2012   Vitamin D deficiency    Weight loss     Past Surgical History:  Procedure Laterality Date   ESOPHAGOGASTRODUODENOSCOPY (EGD) WITH PROPOFOL N/A 12/03/2020   Procedure: ESOPHAGOGASTRODUODENOSCOPY (EGD) WITH PROPOFOL;  Surgeon: Napoleon Form, MD;  Location: [facility];  Service: Endoscopy;  Laterality: N/A;   MOHS SURGERY     nose    Family History  Problem Relation Age of Onset   Colitis Mother    Inflammatory bowel disease Mother    Coronary artery disease Mother 9       Stents   Heart attack Mother    Kidney cancer Mother    Lung cancer Father    Coronary artery disease Paternal Grandfather    Stomach cancer Paternal Grandmother    Stomach cancer Paternal Aunt    Arthritis Sister    Cancer Brother        melanoma   Diabetes Neg Hx    Hyperlipidemia Neg Hx    Hypertension Neg Hx    Sudden death Neg Hx    Esophageal cancer Neg Hx    Pancreatic cancer Neg Hx    Liver disease Neg Hx    Colon cancer Neg Hx     Social History   Socioeconomic History   Marital status: Married    Spouse name: Not on file   Number of children: 0   Years of education: Not on file   Highest education level: Not on file  Occupational History   Not on file  Tobacco Use   Smoking status: Former    Types: Cigarettes    Quit date: 09/28/1976    Years since quitting: 46.5   Smokeless tobacco: Never  Vaping Use   Vaping Use: Never used  Substance and Sexual Activity   Alcohol use: Yes    Comment: occ   Drug use: No   Sexual activity: Not Currently  Other Topics Concern   Not on file  Social History Narrative   2 step daughters and 1 step son   Social  Determinants of Health   Financial Resource Strain: Not on file  Food Insecurity: Not on file  Transportation Needs: Not on file  Physical Activity: Not on file  Stress: Not on file  Social Connections: Not on file  Intimate Partner Violence: Not on file      Review of systems: .Review of Systems   Physical Exam: General: well-appearing ***  Eyes: sclera anicteric, no redness ENT: oral mucosa moist without lesions, no cervical or  supraclavicular lymphadenopathy CV: RRR, no JVD, no peripheral edema Resp: clear to auscultation bilaterally, normal RR and effort noted GI: soft, no tenderness, with active bowel sounds. No guarding or palpable organomegaly noted. Skin; warm and dry, no rash or jaundice noted Neuro: awake, alert and oriented x 3. Normal gross motor function and fluent speech   Data Reviewed:  Reviewed labs, radiology imaging, old records and pertinent past GI work up   Assessment and Plan/Recommendations:  67 year old very pleasant female with dysphagia secondary to cricopharyngeal achalasia and GERD with erosive esophagitis and anal fissure  Anal fissure: Use small pea-sized amount of nitroglycerin 0.125% 3 times daily for 4 weeks followed by twice daily for 8 weeks  Advised patient to avoid continuous use of hydrocortisone cream per rectum, as likely etiology for recurrent anal fissure and nonhealing fissure Avoid perianal moisture Use Benefiber 1 tablespoon 2-3 times daily with meals   GERD with erosive esophagitis and hiatal hernia: Continue with lifestyle modifications and antireflux measures Continue pantoprazole 20 mg daily before breakfast Use Gaviscon after meals for breakthrough heartburn or reflux symptoms  Cricopharyngeal achalasia: Symptoms are manageable we will continue to monitor   History of precancerous polyps, sessile serrated polyp >1 cm removed in January 2022, due for recall colonoscopy January 2025  Return in 3 to 4 months or  sooner if needed  The patient was provided an opportunity to ask questions and all were answered. The patient agreed with the plan and demonstrated an understanding of the instructions.   I,Safa M Kadhim,acting as a scribe for Marsa Aris, MD.,have documented all relevant documentation on the behalf of Marsa Aris, MD,as directed by  Marsa Aris, MD while in the presence of Marsa Aris, MD.   I, Marsa Aris, MD, have reviewed all documentation for this visit. The documentation on 04/07/23 for the exam, diagnosis, procedures, and orders are all accurate and complete.   Iona Beard , MD    CC: Early, Sung Amabile, NP

## 2023-04-08 ENCOUNTER — Ambulatory Visit (INDEPENDENT_AMBULATORY_CARE_PROVIDER_SITE_OTHER): Payer: Medicare Other | Admitting: Gastroenterology

## 2023-04-08 ENCOUNTER — Encounter: Payer: Self-pay | Admitting: Gastroenterology

## 2023-04-08 VITALS — BP 112/62 | HR 84 | Ht 66.0 in | Wt 126.0 lb

## 2023-04-08 DIAGNOSIS — R1310 Dysphagia, unspecified: Secondary | ICD-10-CM

## 2023-04-08 DIAGNOSIS — K222 Esophageal obstruction: Secondary | ICD-10-CM | POA: Diagnosis not present

## 2023-04-08 DIAGNOSIS — K602 Anal fissure, unspecified: Secondary | ICD-10-CM

## 2023-04-08 DIAGNOSIS — K219 Gastro-esophageal reflux disease without esophagitis: Secondary | ICD-10-CM

## 2023-04-08 MED ORDER — AMBULATORY NON FORMULARY MEDICATION: 1 refills | Status: AC

## 2023-04-08 NOTE — Patient Instructions (Signed)
We have sent a prescription for nitroglycerin 0.125% gel to [facility]. You should apply a pea size amount to your rectum three times daily x 6-8 weeks.  [facility]'s information is below: Address: [address] [place], [place] [address]  [phone]  You have been scheduled for an endoscopy. Please follow written instructions given to you at your visit today.  If you use inhalers (even only as needed), please bring them with you on the day of your procedure.  If you take any of the following medications, they will need to be adjusted prior to your procedure:   DO NOT TAKE 7 DAYS PRIOR TO TEST- Trulicity (dulaglutide) Ozempic, Wegovy (semaglutide) Mounjaro (tirzepatide) Bydureon Bcise (exanatide extended release)  DO NOT TAKE 1 DAY PRIOR TO YOUR TEST Rybelsus (semaglutide) Adlyxin (lixisenatide) Victoza (liraglutide) Byetta (exanatide) ___________________________________________________________________________      *Please DO NOT go directly from our office to pick up this medication! Give the pharmacy 1 day to process the prescription as this is compounded and takes time to make.   Anal Fissure, Adult  An anal fissure is a small tear or crack in the tissue near the opening of the butt (anus). In most cases, bleeding from a fissure stops on its own within a few minutes. You may have bleeding each time you poop until the fissure heals. What are the causes? An anal fissure may be caused by large or hard poop (stool). Other causes include: Having trouble pooping (constipation). Getting diarrhea a lot. Inflammatory bowel disease, such as Crohn's disease or ulcerative colitis. Childbirth. Infections. Anal sex. What are the signs or symptoms? Symptoms of an anal fissure include: Bleeding from the rectum. Small amounts of blood seen on your poop, on the toilet paper, or in the toilet after you poop. The blood coats the outside of the poop. It is not  mixed with the poop. Pain when you poop. Itching or irritation around the anus. How is this diagnosed? A health care provider may diagnose a fissure by looking closely at the anal area. In some cases, a rectal exam may be done or a short tube (anoscope) may be used to look at the anal canal. How is this treated? Treatment for an anal fissure may include: Taking steps to avoid and treat constipation. Taking fiber supplements. These can help soften your poop. Taking sitz baths. These can help heal the tear. Using medicated creams or ointments. Doing physical therapy. This can help strengthen the area between your hip bones (pelvis). If other treatments do not work, you may need: Botulinum injections. Surgery to fix the fissure. Follow these instructions at home: Medicines Take or use over-the-counter and prescription medicines only as told by your provider. This includes medicated creams and ointments. Use supplements and medicines to make your poop soft (stool softeners) as told by your provider. Managing constipation You may need to take these actions to prevent or treat constipation: Drink enough fluid to keep your pee (urine) pale yellow. Eat foods that are high in fiber, such as beans, whole grains, and fresh fruits and vegetables. Avoid unripe bananas. Ripe bananas may help if you feel constipated. Limit foods that are high in fat and processed sugars, such as fried or sweet foods. Avoid dairy products, such as milk.  General instructions  Keep the anal area clean and dry. Take sitz baths as told by your provider. Do not use soap in the sitz baths. Contact a health care provider if: You have more  bleeding. You have a fever. You have diarrhea that is mixed with blood. Your pain does not go away. Your problems get worse rather than better. This information is not intended to replace advice given to you by your health care provider. Make sure you discuss any questions you have  with your health care provider. Document Revised: 10/01/2022 Document Reviewed: 10/01/2022 Elsevier Patient Education  2024 [employer].

## 2023-05-26 ENCOUNTER — Telehealth: Payer: Self-pay | Admitting: Gastroenterology

## 2023-05-26 NOTE — Telephone Encounter (Signed)
Inbound call from patient stating that she needs a refill for the compound cream and wants to see if Dr. Lavon Paganini will add lidocaine. Patient is requesting a call to be advised if that is possible. Please advise.

## 2023-05-26 NOTE — Telephone Encounter (Signed)
Left a message for the patient to return my call.  

## 2023-05-26 NOTE — Telephone Encounter (Signed)
Sure it fine to add Lidocaine 2-3% along with 0.125% Nitroglycerine to apply small pea size amount per rectum TID X 6-8 weeks

## 2023-05-26 NOTE — Telephone Encounter (Signed)
Dr Lavon Paganini please advise on adding Lidocaine to her Nitroglycerin ointment

## 2023-05-27 NOTE — Telephone Encounter (Signed)
Called the patient and informed her that I would call the prescription into [facility] for her.   Called and spoke to the pharmacists and added lidocaine to patients prescription.

## 2023-06-15 ENCOUNTER — Ambulatory Visit (AMBULATORY_SURGERY_CENTER): Payer: Medicare Other | Admitting: Gastroenterology

## 2023-06-15 ENCOUNTER — Encounter: Payer: Self-pay | Admitting: Gastroenterology

## 2023-06-15 VITALS — BP 100/58 | HR 83 | Temp 99.1°F | Resp 8 | Ht 66.0 in | Wt 126.0 lb

## 2023-06-15 DIAGNOSIS — R1310 Dysphagia, unspecified: Secondary | ICD-10-CM | POA: Diagnosis not present

## 2023-06-15 DIAGNOSIS — K222 Esophageal obstruction: Secondary | ICD-10-CM | POA: Diagnosis not present

## 2023-06-15 DIAGNOSIS — Z538 Procedure and treatment not carried out for other reasons: Secondary | ICD-10-CM

## 2023-06-15 DIAGNOSIS — K219 Gastro-esophageal reflux disease without esophagitis: Secondary | ICD-10-CM

## 2023-06-15 MED ORDER — LIDOCAINE (ANORECTAL) 5 % EX CREA: 1.0000 | TOPICAL_CREAM | Freq: Every day | CUTANEOUS | 0 refills | Status: AC

## 2023-06-15 MED ORDER — SODIUM CHLORIDE 0.9 % IV SOLN: 500.0000 mL | Freq: Once | INTRAVENOUS | Status: DC

## 2023-06-15 NOTE — Patient Instructions (Addendum)
-   Soft diet. - Continue present medications. - Refer to ENT for management of Cricopharyngeal achalasia, upper esophageal sphincter dilation and +/- Botox - Use small pea size amount of Nitroglycerine 0.125% 3-4 times daily alsong with Recticare for 2-3 months - Refer to colorectal surgery for chronic non healing anal fissure  YOU HAD AN ENDOSCOPIC PROCEDURE TODAY AT THE [facility]:   Refer to the procedure report that was given to you for any specific questions about what was found during the examination.  If the procedure report does not answer your questions, please call your gastroenterologist to clarify.  If you requested that your care partner not be given the details of your procedure findings, then the procedure report has been included in a sealed envelope for you to review at your convenience later.  YOU SHOULD EXPECT: Some feelings of bloating in the abdomen. Passage of more gas than usual.  Walking can help get rid of the air that was put into your GI tract during the procedure and reduce the bloating. If you had a lower endoscopy (such as a colonoscopy or flexible sigmoidoscopy) you may notice spotting of blood in your stool or on the toilet paper. If you underwent a bowel prep for your procedure, you may not have a normal bowel movement for a few days.  Please Note:  You might notice some irritation and congestion in your nose or some drainage.  This is from the oxygen used during your procedure.  There is no need for concern and it should clear up in a day or so.  SYMPTOMS TO REPORT IMMEDIATELY:  Following upper endoscopy (EGD)  Vomiting of blood or coffee ground material  New chest pain or pain under the shoulder blades  Painful or persistently difficult swallowing  New shortness of breath  Fever of 100F or higher  Black, tarry-looking stools  For urgent or emergent issues, a gastroenterologist can be reached at any hour by calling [phone]. Do not use  MyChart messaging for urgent concerns.    DIET:  We do recommend a small meal at first, but then you may proceed to your regular diet.  Drink plenty of fluids but you should avoid alcoholic beverages for 24 hours.  ACTIVITY:  You should plan to take it easy for the rest of today and you should NOT DRIVE or use heavy machinery until tomorrow (because of the sedation medicines used during the test).    FOLLOW UP: Our staff will call the number listed on your records the next business day following your procedure.  We will call around 7:15- 8:00 am to check on you and address any questions or concerns that you may have regarding the information given to you following your procedure. If we do not reach you, we will leave a message.     If any biopsies were taken you will be contacted by phone or by letter within the next 1-3 weeks.  Please call us at [phone] if you have not heard about the biopsies in 3 weeks.    SIGNATURES/CONFIDENTIALITY: You and/or your care partner have signed paperwork which will be entered into your electronic medical record.  These signatures attest to the fact that that the information above on your After Visit Summary has been reviewed and is understood.  Full responsibility of the confidentiality of this discharge information lies with you and/or your care-partner.

## 2023-06-15 NOTE — Progress Notes (Unsigned)
Report to PACU, RN, vss, BBS= Clear.  

## 2023-06-15 NOTE — Progress Notes (Unsigned)
[facility] History and Physical   Primary Care Physician:  Early, Sung Amabile, NP   Reason for Procedure:  Dysphagia  Plan:    EGD  with possible interventions as needed     HPI: Jaclyn Spencer is a very pleasant 67 y.o. female here for EGD for dysphagia and GERD.   The risks and benefits as well as alternatives of endoscopic procedure(s) have been discussed and reviewed. All questions answered. The patient agrees to proceed.    Past Medical History:  Diagnosis Date   Abdominal pain    Abdominal pain, left upper quadrant 02/27/2010   Qualifier: Diagnosis of  By: Juanda Chance MD, Hedwig Morton    Abdominal pain, right lower quadrant 05/29/2010   Qualifier: Diagnosis of  By: Beverely Low MD, Natalia Leatherwood     Allergy    rhinitis   Anxiety    At high risk for tick borne illness 03/06/2022   B12 deficiency    Basal cell carcinoma    Cataract    CHEST PAIN 05/29/2010   Qualifier: Diagnosis of  By: Beverely Low MD, Katherine     Diverticulosis    Encounter for screening colonoscopy 07/16/2016   Epigastric pain 08/27/2011   GERD (gastroesophageal reflux disease)    Insomnia 07/16/2016   Left-sided muscle weakness 07/08/2021   Loss of weight 02/27/2010   Qualifier: Diagnosis of   By: Juanda Chance MD, Hedwig Morton      Nausea 03/06/2022   Shoulder pain 08/27/2011   Tick bite 03/08/2012   Vitamin D deficiency    Weight loss     Past Surgical History:  Procedure Laterality Date   ESOPHAGOGASTRODUODENOSCOPY (EGD) WITH PROPOFOL N/A 12/03/2020   Procedure: ESOPHAGOGASTRODUODENOSCOPY (EGD) WITH PROPOFOL;  Surgeon: Napoleon Form, MD;  Location: [facility];  Service: Endoscopy;  Laterality: N/A;   MOHS SURGERY     nose    Prior to Admission medications   Medication Sig Start Date End Date Taking? Authorizing Provider  AMBULATORY NON FORMULARY MEDICATION Medication Name: Use pea sized amount of Nitroglycerin 0.125% pea sized amount per rectum three times a day for 8 weeks 04/08/23  Yes Jaquarius Seder, Eleonore Chiquito, MD  loratadine (CLARITIN) 10 MG tablet Take 1 tablet (10 mg total) by mouth daily. 11/10/21  Yes Early, Sung Amabile, NP  Multiple Vitamins-Minerals (MULTIVITAMIN WITH MINERALS) tablet Take 1 tablet by mouth 3 (three) times a week.   Yes [provider]  pantoprazole (PROTONIX) 20 MG tablet Alternate one tab (20mg ) with separate prescription for 40mg  as directed. 09/04/22  Yes Early, Sung Amabile, NP  pantoprazole (PROTONIX) 40 MG tablet Take 1 tablet (40 mg total) by mouth daily. 06/22/22  Yes Quentin Mulling R, PA-C  Azelastine HCl 137 MCG/SPRAY SOLN Place 137 mcg into the nose as needed. 01/11/18   [provider]  conjugated estrogens (PREMARIN) vaginal cream Place 1 Applicatorful vaginally daily. 09/04/22   Tollie Eth, NP  cyclobenzaprine (FLEXERIL) 5 MG tablet Take 1 tablet (5 mg total) by mouth at bedtime. 09/14/22   Tollie Eth, NP  loperamide (IMODIUM A-D) 2 MG tablet Take 2 tablets at the first sign of diarrhea and 1 additional tablet with each additional stool. Max 8 pills in 24 hours. 11/19/22   Tollie Eth, NP  meloxicam (MOBIC) 7.5 MG tablet Take 1 tablet (7.5 mg total) by mouth daily. 09/14/22   Tollie Eth, NP  promethazine (PHENERGAN) 25 MG tablet Take 1 tablet (25 mg total) by mouth every 6 (six) hours as needed  for nausea or vomiting. 11/19/22   Early, Sung Amabile, NP  temazepam (RESTORIL) 7.5 MG capsule Take 7.5 mg by mouth as needed. 03/27/14   [provider]    Current Outpatient Medications  Medication Sig Dispense Refill   AMBULATORY NON FORMULARY MEDICATION Medication Name: Use pea sized amount of Nitroglycerin 0.125% pea sized amount per rectum three times a day for 8 weeks 30 g 1   loratadine (CLARITIN) 10 MG tablet Take 1 tablet (10 mg total) by mouth daily. 30 tablet 11   Multiple Vitamins-Minerals (MULTIVITAMIN WITH MINERALS) tablet Take 1 tablet by mouth 3 (three) times a week.     pantoprazole (PROTONIX) 20 MG tablet Alternate one tab (20mg ) with  separate prescription for 40mg  as directed. 90 tablet 1   pantoprazole (PROTONIX) 40 MG tablet Take 1 tablet (40 mg total) by mouth daily. 90 tablet 3   Azelastine HCl 137 MCG/SPRAY SOLN Place 137 mcg into the nose as needed.     conjugated estrogens (PREMARIN) vaginal cream Place 1 Applicatorful vaginally daily. 42.5 g 12   cyclobenzaprine (FLEXERIL) 5 MG tablet Take 1 tablet (5 mg total) by mouth at bedtime. 20 tablet 1   loperamide (IMODIUM A-D) 2 MG tablet Take 2 tablets at the first sign of diarrhea and 1 additional tablet with each additional stool. Max 8 pills in 24 hours. 30 tablet 0   meloxicam (MOBIC) 7.5 MG tablet Take 1 tablet (7.5 mg total) by mouth daily. 30 tablet 0   promethazine (PHENERGAN) 25 MG tablet Take 1 tablet (25 mg total) by mouth every 6 (six) hours as needed for nausea or vomiting. 30 tablet 3   temazepam (RESTORIL) 7.5 MG capsule Take 7.5 mg by mouth as needed.     Current Facility-Administered Medications  Medication Dose Route Frequency Provider Last Rate Last Admin   0.9 %  sodium chloride infusion  500 mL Intravenous Once Napoleon Form, MD        Allergies as of 06/15/2023   (No Known Allergies)    Family History  Problem Relation Age of Onset   Colitis Mother    Inflammatory bowel disease Mother    Coronary artery disease Mother 35       Stents   Heart attack Mother    Kidney cancer Mother    Lung cancer Father    Arthritis Sister    Cancer Brother        melanoma   Stomach cancer Paternal Aunt    Stomach cancer Paternal Grandmother    Coronary artery disease Paternal Grandfather    Diabetes Neg Hx    Hyperlipidemia Neg Hx    Hypertension Neg Hx    Sudden death Neg Hx    Esophageal cancer Neg Hx    Pancreatic cancer Neg Hx    Liver disease Neg Hx    Colon cancer Neg Hx    Rectal cancer Neg Hx     Social History   Socioeconomic History   Marital status: Married    Spouse name: Not on file   Number of children: 0   Years of  education: Not on file   Highest education level: Not on file  Occupational History   Not on file  Tobacco Use   Smoking status: Former    Current packs/day: 0.00    Types: Cigarettes    Quit date: 09/28/1976    Years since quitting: 46.7   Smokeless tobacco: Never  Vaping Use   Vaping status: Never  Used  Substance and Sexual Activity   Alcohol use: Yes    Comment: occ   Drug use: No   Sexual activity: Not Currently  Other Topics Concern   Not on file  Social History Narrative   2 step daughters and 1 step son   Social Determinants of Health   Financial Resource Strain: Not on file  Food Insecurity: Not on file  Transportation Needs: Not on file  Physical Activity: Not on file  Stress: Not on file  Social Connections: Not on file  Intimate Partner Violence: Not on file    Review of Systems:  All other review of systems negative except as mentioned in the HPI.  Physical Exam: Vital signs in last 24 hours: BP (!) 157/86   Pulse 79   Temp 99.1 F (37.3 C)   Resp 13   Ht 5\' 6"  (1.676 m)   Wt 126 lb (57.2 kg)   SpO2 98%   BMI 20.34 kg/m  General:   Alert, NAD Lungs:  Clear .   Heart:  Regular rate and rhythm Abdomen:  Soft, nontender and nondistended. Neuro/Psych:  Alert and cooperative. Normal mood and affect. A and O x 3  Reviewed labs, radiology imaging, old records and pertinent past GI work up  Patient is appropriate for planned procedure(s) and anesthesia in an ambulatory setting   K. Scherry Ran , MD [phone]

## 2023-06-15 NOTE — Op Note (Signed)
[facility] Patient Name: Jaclyn Spencer Procedure Date: 06/15/2023 10:16 AM MRN: [id number] Endoscopist: Napoleon Form , MD, [id number] Age: 67 Referring MD:  Date of Birth: May 02, 1956 Gender: Female Account #: [id number] Procedure:                Upper GI endoscopy Indications:              Dysphagia Medicines:                Monitored Anesthesia Care Procedure:                Pre-Anesthesia Assessment:                           - Prior to the procedure, a History and Physical                            was performed, and patient medications and                            allergies were reviewed. The patient's tolerance of                            previous anesthesia was also reviewed. The risks                            and benefits of the procedure and the sedation                            options and risks were discussed with the patient.                            All questions were answered, and informed consent                            was obtained. Prior Anticoagulants: The patient has                            taken no anticoagulant or antiplatelet agents. ASA                            Grade Assessment: II - A patient with mild systemic                            disease. After reviewing the risks and benefits,                            the patient was deemed in satisfactory condition to                            undergo the procedure.                           After obtaining informed consent, the endoscope was  passed under direct vision. Throughout the                            procedure, the patient's blood pressure, pulse, and                            oxygen saturations were monitored continuously. The                            GIF [id number] #[id number] was introduced through the                            mouth, with the intention of advancing to the                            duodenum. The scope was advanced to the                             cricopharyngeal esophagus before the procedure was                            aborted. Medications were given. The upper GI                            endoscopy was performed with difficulty due to                            stenosis. The patient tolerated the procedure well. Scope In: Scope Out: Findings:                 The nasopharynx and oropharynx were normal. Scope                            could not be advanced beyond cricopharyngeal                            esophagus, significant resistance, procedure was                            aborted Complications:            No immediate complications. Estimated Blood Loss:     Estimated blood loss: none. Impression:               - Normal nasopharynx and oropharynx.                           Scope could not be advanced beyond cricopharyngeal                            esophagus, significant resistance, procedure was                            aborted                           - No  specimens collected. Recommendation:           - Soft diet.                           - Continue present medications.                           - Refer to ENT for management of Cricopharyngeal                            achalasia, upper esophageal sphincter dilation and                            +/- Botox                           - Use small pea size amount of Nitroglycerine                            0.125% 3-4 times daily alsong with Recticare for                            2-3 months                           - Refer to colorectal surgery for chronic non                            healing anal fissure Napoleon Form, MD 06/15/2023 10:43:05 AM This report has been signed electronically.

## 2023-06-16 ENCOUNTER — Telehealth: Payer: Self-pay | Admitting: *Deleted

## 2023-06-16 NOTE — Telephone Encounter (Signed)
Post procedure follow up call placed, no answer and left VM.  

## 2023-06-18 ENCOUNTER — Telehealth: Payer: Self-pay | Admitting: *Deleted

## 2023-06-18 NOTE — Telephone Encounter (Signed)
Patient needs referral to [facility] for chronic non healing anal fissure    Faxed records today

## 2023-06-25 ENCOUNTER — Telehealth: Payer: Self-pay | Admitting: Gastroenterology

## 2023-06-25 DIAGNOSIS — K220 Achalasia of cardia: Secondary | ICD-10-CM

## 2023-06-25 DIAGNOSIS — K229 Disease of esophagus, unspecified: Secondary | ICD-10-CM

## 2023-06-25 NOTE — Telephone Encounter (Addendum)
I put in the referral for Dr Suszanne Conners [address] [place]  [phone]. I called today at 3:57 but they are closed. I will try back Monday Morning   Called patient to inform her she has an appointment at [facility] on 10/28 from another referral I was working on.  And I will call  Dr Suszanne Conners Monday Morning , patient thinks she has seen him in the past

## 2023-06-25 NOTE — Telephone Encounter (Signed)
Refer to ENT for management of Cricopharyngeal                            achalasia, upper esophageal sphincter dilation and                            +/- Botox    Inbound call from patient requesting a follow up on referral to ENT. Would like to know what office she was referred to. Please advise.

## 2023-06-29 NOTE — Telephone Encounter (Signed)
Patient is scheduled on 10/28th

## 2023-07-01 NOTE — Telephone Encounter (Signed)
Called patient with date and times of her referral appointments

## 2023-07-01 NOTE — Telephone Encounter (Signed)
I Have called and registered patient for an appointment but they are saying she may need a current Barium Swallow last one was in 2022 that is in Epic.  They have scheduled her for a FEES Swallowing study first before she is seen in the office but both will be done on the same day    10/21/2023 9 am Swallow study  will be done first the Dr Costella Hatcher [facility]. They will mail the patient out a packet of information.   Fax number to fax our records to them is [phone]

## 2023-07-01 NOTE — Telephone Encounter (Signed)
Inbound call fro patient requesting her referral to be sent to [facility] in [place] on [address].   Contact number for provider office is [phone]. Patient unable to provider a fax number.   Please advise.

## 2023-07-02 NOTE — Telephone Encounter (Signed)
Faxed records today.

## 2023-07-18 ENCOUNTER — Other Ambulatory Visit: Payer: Self-pay | Admitting: Physician Assistant

## 2023-07-18 DIAGNOSIS — R1319 Other dysphagia: Secondary | ICD-10-CM

## 2023-07-18 DIAGNOSIS — K219 Gastro-esophageal reflux disease without esophagitis: Secondary | ICD-10-CM

## 2023-07-18 DIAGNOSIS — K2100 Gastro-esophageal reflux disease with esophagitis, without bleeding: Secondary | ICD-10-CM

## 2023-07-23 ENCOUNTER — Other Ambulatory Visit: Payer: Self-pay | Admitting: Physician Assistant

## 2023-07-23 DIAGNOSIS — K219 Gastro-esophageal reflux disease without esophagitis: Secondary | ICD-10-CM

## 2023-07-23 DIAGNOSIS — K2100 Gastro-esophageal reflux disease with esophagitis, without bleeding: Secondary | ICD-10-CM

## 2023-07-23 DIAGNOSIS — R1319 Other dysphagia: Secondary | ICD-10-CM

## 2023-08-04 ENCOUNTER — Encounter: Payer: Self-pay | Admitting: Gastroenterology

## 2023-08-09 ENCOUNTER — Telehealth: Payer: Self-pay | Admitting: Gastroenterology

## 2023-08-09 NOTE — Telephone Encounter (Signed)
Inbound call from patient stating she started having rectal bleeding this morning. Patient is requesting a call to discuss and to be advised further. Please advise, thank you.

## 2023-08-09 NOTE — Telephone Encounter (Signed)
Called the patient back. No answer. Left message of the return call.

## 2023-08-10 ENCOUNTER — Encounter: Payer: Self-pay | Admitting: Nurse Practitioner

## 2023-08-10 ENCOUNTER — Telehealth: Payer: Self-pay | Admitting: Nurse Practitioner

## 2023-08-10 ENCOUNTER — Ambulatory Visit: Payer: Medicare Other | Admitting: Nurse Practitioner

## 2023-08-10 VITALS — BP 130/82 | HR 70 | Wt 126.6 lb

## 2023-08-10 DIAGNOSIS — K625 Hemorrhage of anus and rectum: Secondary | ICD-10-CM

## 2023-08-10 DIAGNOSIS — K529 Noninfective gastroenteritis and colitis, unspecified: Secondary | ICD-10-CM | POA: Diagnosis not present

## 2023-08-10 DIAGNOSIS — F418 Other specified anxiety disorders: Secondary | ICD-10-CM | POA: Diagnosis not present

## 2023-08-10 DIAGNOSIS — K602 Anal fissure, unspecified: Secondary | ICD-10-CM | POA: Diagnosis not present

## 2023-08-10 MED ORDER — NIFEDIPINE 0.3 % OINTMENT
1.0000 | TOPICAL_OINTMENT | Freq: Four times a day (QID) | CUTANEOUS | 2 refills | Status: DC
Start: 2023-08-10 — End: 2023-11-03

## 2023-08-10 MED ORDER — ONDANSETRON 8 MG PO TBDP
8.0000 mg | ORAL_TABLET | Freq: Three times a day (TID) | ORAL | 1 refills | Status: DC | PRN
Start: 2023-08-10 — End: 2023-11-03

## 2023-08-10 MED ORDER — PROMETHAZINE HCL 25 MG PO TABS
25.0000 mg | ORAL_TABLET | Freq: Four times a day (QID) | ORAL | 3 refills | Status: DC | PRN
Start: 2023-08-10 — End: 2023-11-03

## 2023-08-10 MED ORDER — NIFEDIPINE 0.3 % OINTMENT
1.0000 | TOPICAL_OINTMENT | Freq: Four times a day (QID) | CUTANEOUS | 2 refills | Status: DC
Start: 2023-08-10 — End: 2023-08-10

## 2023-08-10 MED ORDER — ALPRAZOLAM 0.25 MG PO TABS
0.2500 mg | ORAL_TABLET | Freq: Two times a day (BID) | ORAL | 2 refills | Status: AC | PRN
Start: 2023-08-10 — End: 2024-08-09

## 2023-08-10 NOTE — Telephone Encounter (Signed)
[facility] called  They do not carry   nifedipine 0.3 % ointment  Not sure if compounded ?

## 2023-08-10 NOTE — Progress Notes (Signed)
Tollie Eth, DNP, AGNP-c [facility] [address] [place], [place] [address] [phone]   ACUTE VISIT- ESTABLISHED PATIENT  Blood pressure 130/82, pulse 70, weight 126 lb 9.6 oz (57.4 kg).  Subjective:  HPI Jaclyn Spencer is a 67 y.o. female presents to day for evaluation of acute concern(s).   History of Present Illness Jaclyn Spencer presented with concerns about rectal bleeding that occurred the previous day. The bleeding was described as bright red and was noticed on the toilet paper. The patient reported no bleeding on the day of the consultation. The patient has a history of hemorrhoids and a fissure, which she believes may have contributed to the bleeding. She also reported a change in bowel habits, requiring daily fiber intake to bulk up the stool. The patient noted that her stools have been loosening up and she has been experiencing some nausea. The stool color was described as dark, but not black.  The patient is scheduled for a colonoscopy in January due to the ongoing fissure issue. She has been using nitroglycerin and antihistamines to manage the fissure, but it has not fully healed over the past year. The patient expressed dissatisfaction with her current healing process and treatment options.   In addition to the gastrointestinal issues, the patient also reported a bad shoulder. She mentioned an upcoming procedure involving an "in space balloon" as an alternative to shoulder replacement. The patient expressed apprehension about shoulder replacement due to the long recovery time experienced with a previous shoulder procedure.  The patient also mentioned an upcoming trip to [place] and expressed concerns about managing her health issues while traveling. She requested prescriptions for anti-nausea medication and discussed her current regimen of multivitamins, B12, and calcium. The patient expressed uncertainty about the necessity of the multivitamin but is committed to  maintaining her calcium intake due to concerns about bone health.   ROS negative except for what is listed in HPI. History, Medications, Surgery, SDOH, and Family History reviewed and updated as appropriate.  Objective:  Physical Exam Vitals and nursing note reviewed.  Constitutional:      General: She is not in acute distress.    Appearance: Normal appearance. She is not ill-appearing.  HENT:     Head: Normocephalic.  Eyes:     Conjunctiva/sclera: Conjunctivae normal.     Pupils: Pupils are equal, round, and reactive to light.  Neck:     Vascular: No carotid bruit.  Cardiovascular:     Rate and Rhythm: Normal rate and regular rhythm.     Pulses: Normal pulses.     Heart sounds: Normal heart sounds.  Pulmonary:     Effort: Pulmonary effort is normal.     Breath sounds: Normal breath sounds.  Abdominal:     General: Abdomen is flat. Bowel sounds are normal. There is no distension.     Palpations: Abdomen is soft.     Tenderness: There is no abdominal tenderness. There is no right CVA tenderness, left CVA tenderness or guarding.  Genitourinary:    Comments: Non thrombosed external hemorrhoid present on the 4 oclock position with evidence of fissure present. No bleeding at the time of examination.  Musculoskeletal:        General: Normal range of motion.     Cervical back: Normal range of motion.  Skin:    General: Skin is warm.     Capillary Refill: Capillary refill takes less than 2 seconds.  Neurological:     General: No focal deficit present.  Mental Status: She is alert and oriented to person, place, and time.  Psychiatric:        Mood and Affect: Mood normal.        Behavior: Behavior normal.          Assessment & Plan:   Problem List Items Addressed This Visit     Anal fissure - Primary    Chronic anal fissure present for over a year, with ongoing pain and slow healing. Previous treatments include nitroglycerin and diltiazem ointments. Patient reports  improvement but still experiencing discomfort. Discussed potential use of nifedipine ointment and Botox injections as alternative treatments. Informed about the risks and benefits of Botox injections, including its use in other conditions like migraines and urinary incontinence. She is aware that we can send a referral for this if she would like to consider this treatment option.  - Prescribe nifedipine ointment, apply four times daily - Alternate nifedipine with nitroglycerin if needed - Consider Botox injections if conservative treatments fail - Continue using lidocaine for pain management      Relevant Medications   nifedipine 0.3 % ointment   Gastroenteritis    Intermittent nausea, possibly related to a recent gastrointestinal infection. Previous use of antiemetics was effective. Discussed the use of Phenergan for severe nausea and Zofran for mild to moderate nausea. - Prescribe Phenergan for severe nausea - Prescribe Zofran for mild to moderate nausea      Relevant Medications   promethazine (PHENERGAN) 25 MG tablet   ondansetron (ZOFRAN-ODT) 8 MG disintegrating tablet   Other specified anxiety disorders    Increased anxiety possibly contributing to gastrointestinal symptoms. Patient prefers occasional medication rather than daily maintenance. Discussed the use of Xanax for occasional anxiety relief. - Prescribe Xanax for occasional use      Relevant Medications   ALPRAZolam (XANAX) 0.25 MG tablet   Rectal bleeding    Intermittent rectal bleeding, likely due to hemorrhoids or an anal fissure. Bright red blood suggests lower gastrointestinal tract bleeding. No melena reported, which is reassuring. Possible irritation from a recent gastrointestinal infection.  - Monitor for changes in bleeding pattern - Continue fiber supplementation - Proceed with scheduled colonoscopy in January          Tollie Eth, DNP, AGNP-c

## 2023-08-10 NOTE — Patient Instructions (Signed)
Please let me know if you have any concerns or issues.

## 2023-08-12 NOTE — Telephone Encounter (Signed)
Patient seen by PCP. Treated for anal fissure.

## 2023-08-17 ENCOUNTER — Encounter: Payer: Self-pay | Admitting: Nurse Practitioner

## 2023-08-17 DIAGNOSIS — K625 Hemorrhage of anus and rectum: Secondary | ICD-10-CM

## 2023-08-17 DIAGNOSIS — F418 Other specified anxiety disorders: Secondary | ICD-10-CM | POA: Insufficient documentation

## 2023-08-17 HISTORY — DX: Hemorrhage of anus and rectum: K62.5

## 2023-08-17 NOTE — Assessment & Plan Note (Signed)
Chronic anal fissure present for over a year, with ongoing pain and slow healing. Previous treatments include nitroglycerin and diltiazem ointments. Patient reports improvement but still experiencing discomfort. Discussed potential use of nifedipine ointment and Botox injections as alternative treatments. Informed about the risks and benefits of Botox injections, including its use in other conditions like migraines and urinary incontinence. She is aware that we can send a referral for this if she would like to consider this treatment option.  - Prescribe nifedipine ointment, apply four times daily - Alternate nifedipine with nitroglycerin if needed - Consider Botox injections if conservative treatments fail - Continue using lidocaine for pain management

## 2023-08-17 NOTE — Assessment & Plan Note (Signed)
Increased anxiety possibly contributing to gastrointestinal symptoms. Patient prefers occasional medication rather than daily maintenance. Discussed the use of Xanax for occasional anxiety relief. - Prescribe Xanax for occasional use

## 2023-08-17 NOTE — Assessment & Plan Note (Signed)
Intermittent rectal bleeding, likely due to hemorrhoids or an anal fissure. Bright red blood suggests lower gastrointestinal tract bleeding. No melena reported, which is reassuring. Possible irritation from a recent gastrointestinal infection.  - Monitor for changes in bleeding pattern - Continue fiber supplementation - Proceed with scheduled colonoscopy in January

## 2023-08-17 NOTE — Assessment & Plan Note (Signed)
Intermittent nausea, possibly related to a recent gastrointestinal infection. Previous use of antiemetics was effective. Discussed the use of Phenergan for severe nausea and Zofran for mild to moderate nausea. - Prescribe Phenergan for severe nausea - Prescribe Zofran for mild to moderate nausea

## 2023-09-07 ENCOUNTER — Encounter: Payer: Self-pay | Admitting: Nurse Practitioner

## 2023-09-07 ENCOUNTER — Ambulatory Visit (INDEPENDENT_AMBULATORY_CARE_PROVIDER_SITE_OTHER): Payer: Medicare Other | Admitting: Nurse Practitioner

## 2023-09-07 VITALS — BP 128/82 | HR 86 | Ht 66.0 in | Wt 128.0 lb

## 2023-09-07 DIAGNOSIS — R3915 Urgency of urination: Secondary | ICD-10-CM | POA: Diagnosis not present

## 2023-09-07 DIAGNOSIS — E538 Deficiency of other specified B group vitamins: Secondary | ICD-10-CM

## 2023-09-07 DIAGNOSIS — E559 Vitamin D deficiency, unspecified: Secondary | ICD-10-CM | POA: Diagnosis not present

## 2023-09-07 DIAGNOSIS — E2839 Other primary ovarian failure: Secondary | ICD-10-CM

## 2023-09-07 DIAGNOSIS — N952 Postmenopausal atrophic vaginitis: Secondary | ICD-10-CM

## 2023-09-07 DIAGNOSIS — Z0000 Encounter for general adult medical examination without abnormal findings: Secondary | ICD-10-CM

## 2023-09-07 DIAGNOSIS — Z23 Encounter for immunization: Secondary | ICD-10-CM

## 2023-09-07 DIAGNOSIS — Z1231 Encounter for screening mammogram for malignant neoplasm of breast: Secondary | ICD-10-CM

## 2023-09-07 DIAGNOSIS — J0100 Acute maxillary sinusitis, unspecified: Secondary | ICD-10-CM

## 2023-09-07 LAB — MICROSCOPIC EXAMINATION

## 2023-09-07 MED ORDER — FLUCONAZOLE 150 MG PO TABS: 150.0000 mg | ORAL_TABLET | Freq: Once | ORAL | 2 refills | Status: AC

## 2023-09-07 MED ORDER — ZOSTER VAC RECOMB ADJUVANTED 50 MCG/0.5ML IM SUSR: 0.5000 mL | Freq: Once | INTRAMUSCULAR | 1 refills | Status: AC

## 2023-09-07 MED ORDER — AMOXICILLIN-POT CLAVULANATE 875-125 MG PO TABS: 1.0000 | ORAL_TABLET | Freq: Two times a day (BID) | ORAL | 0 refills | Status: DC

## 2023-09-07 MED ORDER — PREMARIN 0.625 MG/GM VA CREA: TOPICAL_CREAM | VAGINAL | 12 refills | Status: DC

## 2023-09-07 NOTE — Patient Instructions (Signed)
  Ms. Dyce , Thank you for taking time to come for your Medicare Wellness Visit. I appreciate your ongoing commitment to your health goals. Please review the following plan we discussed and let me know if I can assist you in the future.   These are the goals we discussed:  Goals       Patient Stated (pt-stated)      Improve leg strength.         This is a list of the screening recommended for you and due dates:  Health Maintenance  Topic Date Due   Zoster (Shingles) Vaccine (1 of 2) Never done   Mammogram  06/28/2016   COVID-19 Vaccine (3 - 2023-24 season) 09/23/2023*   Colon Cancer Screening  11/26/2023*   Medicare Annual Wellness Visit  09/06/2024   DTaP/Tdap/Td vaccine (2 - Td or Tdap) 04/17/2031   Pneumonia Vaccine  Completed   DEXA scan (bone density measurement)  Completed   Hepatitis C Screening  Completed   HPV Vaccine  Aged Out   Flu Shot  Discontinued  *Topic was postponed. The date shown is not the original due date.

## 2023-09-07 NOTE — Progress Notes (Unsigned)
Subjective:    Jaclyn Spencer is a 67 y.o. female who presents for Preventative Services visit and chronic medical problems/med check visit.    Primary Care Provider Sahmya Arai, Sung Amabile, NP here for primary care  Current Health Care Team: Dentist, Dr.  Larose Kells doctor, Dr.   Medical Services you may have received from other than [facility] providers in the past year (date may be approximate) The patient, with a history of macular degeneration, presents with symptoms suggestive of a urinary tract infection and a recent upper respiratory infection. The patient reports a week-long history of cold-like symptoms, including congestion, which she attributes to recent travel. She denies any ear pain or pressure associated with these symptoms.  The patient also reports urinary urgency, which has been increasing in frequency over time. She describes waking up in the morning with a significant urge to urinate, sometimes struggling to reach the bathroom in time. This urgency is noted approximately four days a week, with occasional associated leakage during sneezing or coughing. The patient denies any associated burning, low back pain, or unusual odor to the urine, although she does note a strong odor at times.  In addition to these symptoms, the patient expresses a desire to improve her leg strength as a personal health goal for the upcoming year. She reports engaging in regular exercise, including walking and following a fitness program on [employer].  The patient also mentions a history of low vitamin D and B12 levels, which she managed by altering her diet and taking supplements. She reports a significant improvement in her overall well-being following these changes.  Lastly, the patient discloses a diagnosis of Analysse Quinonez-stage macular degeneration, which is currently being monitored and managed with vitamins. She expresses concern about this condition but reports that her hearing remains good.  Exercise Current exercise habits:  Home exercise routine includes walking 1 hrs per day and [employer] on [employer].   Nutrition/Diet Current diet: in general, a "healthy" diet  , well balanced  Depression Screen    09/07/2023    8:27 AM  Depression screen PHQ 2/9  Decreased Interest 0  Down, Depressed, Hopeless 0  PHQ - 2 Score 0    Activities of Daily Living Screen/Functional Status Survey Is the patient deaf or have difficulty hearing?: No Does the patient have difficulty seeing, even when wearing glasses/contacts?: Yes Does the patient have difficulty concentrating, remembering, or making decisions?: No Does the patient have difficulty walking or climbing stairs?: No Does the patient have difficulty dressing or bathing?: No Does the patient have difficulty doing errands alone such as visiting a doctor's office or shopping?: No  Can patient draw a clock face showing 3:15 oclock, yes  Fall Risk Screen    09/07/2023    8:27 AM 09/04/2022    8:35 AM 03/05/2022   11:08 AM 08/14/2021    5:51 PM 07/08/2021   10:52 AM  Fall Risk   Falls in the past year? 0 0 0 0 0  Number falls in past yr: 0 0 0 0 0  Injury with Fall? 0 0 0 0 0  Risk for fall due to : No Fall Risks No Fall Risks No Fall Risks No Fall Risks No Fall Risks  Follow up Falls evaluation completed Falls evaluation completed Falls evaluation completed;Education provided Falls evaluation completed;Education provided     Gait Assessment: Normal gait observed yes  Advanced directives Does patient have a Health Care Power of Attorney? No Does patient have a Living Will? No  Past Medical History:  Diagnosis Date   Abdominal pain    Abdominal pain, left upper quadrant 02/27/2010   Qualifier: Diagnosis of  By: Juanda Chance MD, Hedwig Morton    Abdominal pain, right lower quadrant 05/29/2010   Qualifier: Diagnosis of  By: Beverely Low MD, Natalia Leatherwood     Allergy    rhinitis   Anxiety    At high risk for tick borne illness 03/06/2022   B12 deficiency    Basal cell  carcinoma    Cataract    CHEST PAIN 05/29/2010   Qualifier: Diagnosis of  By: Beverely Low MD, Katherine     COVID-19 10/05/2022   Diverticulosis    Encounter for screening colonoscopy 07/16/2016   Epigastric pain 08/27/2011   Eustachian tube dysfunction 03/08/2012   General medical examination 08/27/2011   GERD (gastroesophageal reflux disease)    Insomnia 07/16/2016   Left-sided muscle weakness 07/08/2021   LLQ abdominal pain 09/14/2022   Loss of weight 02/27/2010   Qualifier: Diagnosis of   By: Juanda Chance MD, Hedwig Morton      Nausea 03/06/2022   Need for shingles vaccine 07/08/2021   Need for vaccination against Streptococcus pneumoniae 09/04/2022   Screening mammogram for breast cancer 09/04/2022   Shoulder pain 08/27/2011   Tick bite 03/08/2012   Vitamin D deficiency    Weight loss     Past Surgical History:  Procedure Laterality Date   ESOPHAGOGASTRODUODENOSCOPY (EGD) WITH PROPOFOL N/A 12/03/2020   Procedure: ESOPHAGOGASTRODUODENOSCOPY (EGD) WITH PROPOFOL;  Surgeon: Napoleon Form, MD;  Location: [facility];  Service: Endoscopy;  Laterality: N/A;   MOHS SURGERY     nose    Social History   Socioeconomic History   Marital status: Married    Spouse name: Not on file   Number of children: 0   Years of education: Not on file   Highest education level: Not on file  Occupational History   Not on file  Tobacco Use   Smoking status: Former    Current packs/day: 0.00    Types: Cigarettes    Quit date: 09/28/1976    Years since quitting: 46.9   Smokeless tobacco: Never  Vaping Use   Vaping status: Never Used  Substance and Sexual Activity   Alcohol use: Yes    Comment: occ   Drug use: No   Sexual activity: Not Currently  Other Topics Concern   Not on file  Social History Narrative   2 step daughters and 1 step son   Social Determinants of Health   Financial Resource Strain: Not on file  Food Insecurity: Not on file  Transportation Needs: Not on file  Physical  Activity: Not on file  Stress: Not on file  Social Connections: Not on file  Intimate Partner Violence: Not on file    Family History  Problem Relation Age of Onset   Colitis Mother    Inflammatory bowel disease Mother    Coronary artery disease Mother 50       Stents   Heart attack Mother    Kidney cancer Mother    Lung cancer Father    Arthritis Sister    Cancer Brother        melanoma   Stomach cancer Paternal Aunt    Stomach cancer Paternal Grandmother    Coronary artery disease Paternal Grandfather    Diabetes Neg Hx    Hyperlipidemia Neg Hx    Hypertension Neg Hx    Sudden death Neg Hx    Esophageal cancer  Neg Hx    Pancreatic cancer Neg Hx    Liver disease Neg Hx    Colon cancer Neg Hx    Rectal cancer Neg Hx      Current Outpatient Medications:    ALPRAZolam (XANAX) 0.25 MG tablet, Take 1 tablet (0.25 mg total) by mouth 2 (two) times daily as needed for anxiety., Disp: 30 tablet, Rfl: 2   AMBULATORY NON FORMULARY MEDICATION, Medication Name: Use pea sized amount of Nitroglycerin 0.125% pea sized amount per rectum three times a day for 8 weeks, Disp: 30 g, Rfl: 1   Azelastine HCl 137 MCG/SPRAY SOLN, Place 137 mcg into the nose as needed., Disp: , Rfl:    Lidocaine, Anorectal, (RECTICARE) 5 % CREA, Apply 1 Application topically daily., Disp: 1 g, Rfl: 0   loperamide (IMODIUM A-D) 2 MG tablet, Take 2 tablets at the first sign of diarrhea and 1 additional tablet with each additional stool. Max 8 pills in 24 hours., Disp: 30 tablet, Rfl: 0   loratadine (CLARITIN) 10 MG tablet, Take 1 tablet (10 mg total) by mouth daily., Disp: 30 tablet, Rfl: 11   meloxicam (MOBIC) 7.5 MG tablet, Take 1 tablet (7.5 mg total) by mouth daily., Disp: 30 tablet, Rfl: 0   Multiple Vitamins-Minerals (MULTIVITAMIN WITH MINERALS) tablet, Take 1 tablet by mouth 3 (three) times a week., Disp: , Rfl:    nifedipine 0.3 % ointment, Place 1 Application rectally 4 (four) times daily., Disp: 30 g, Rfl:  2   ondansetron (ZOFRAN-ODT) 8 MG disintegrating tablet, Take 1 tablet (8 mg total) by mouth every 8 (eight) hours as needed for nausea or vomiting., Disp: 30 tablet, Rfl: 1   pantoprazole (PROTONIX) 40 MG tablet, TAKE ONE TABLET BY MOUTH ONE TIME DAILY, Disp: 90 tablet, Rfl: 3   promethazine (PHENERGAN) 25 MG tablet, Take 1 tablet (25 mg total) by mouth every 6 (six) hours as needed for nausea or vomiting., Disp: 30 tablet, Rfl: 3   conjugated estrogens (PREMARIN) vaginal cream, Place 1 Applicatorful vaginally daily. (Patient not taking: Reported on 09/07/2023), Disp: 42.5 g, Rfl: 12   cyclobenzaprine (FLEXERIL) 5 MG tablet, Take 1 tablet (5 mg total) by mouth at bedtime. (Patient not taking: Reported on 08/10/2023), Disp: 20 tablet, Rfl: 1   temazepam (RESTORIL) 7.5 MG capsule, Take 7.5 mg by mouth as needed. (Patient not taking: Reported on 08/10/2023), Disp: , Rfl:   No Known Allergies  History reviewed: allergies, current medications, past family history, past medical history, past social history, past surgical history and problem list  Chronic issues discussed: Urine leaking  Acute issues discussed: Cold  Objective:      Biometrics BP 128/82   Pulse 86   Ht 5\' 6"  (1.676 m)   Wt 128 lb (58.1 kg)   BMI 20.66 kg/m   Cognitive Testing  Alert? Yes  Normal Appearance?Yes  Oriented to person? Yes  Place? Yes   Time? Yes  Recall of three objects?  Yes  Can perform simple calculations? Yes  Displays appropriate judgment?Yes  Can read the correct time from a watch face?Yes  General appearance: alert, no distress, WD/WN, healthy 67 year old female  Nutritional Status: Inadequate calore intake? yes Loss of muscle mass? no Loss of fat beneath skin? no Localized or general edema? No Diminished functional status? no  Other pertinent exam: HEENT: normocephalic, sclerae anicteric, TMs pearly, nares patent, no discharge or erythema, pharynx normal Oral cavity: MMM, no  lesions Neck: supple, no lymphadenopathy, no thyromegaly, no  masses Heart: RRR, normal S1, S2, no murmurs Lungs: CTA bilaterally, no wheezes, rhonchi, or rales Abdomen: +bs, soft, non tender, non distended, no masses, no hepatomegaly, no splenomegaly Musculoskeletal: nontender, no swelling, no obvious deformity Extremities: no edema, no cyanosis, no clubbing Pulses: 2+ symmetric, upper and lower extremities, normal cap refill Neurological: alert, oriented x 3, CN2-12 intact, strength normal upper extremities and lower extremities, sensation normal throughout, DTRs 2+ throughout, no cerebellar signs, gait normal Psychiatric: normal affect, behavior normal, pleasant    Assessment:  No diagnosis found.   Plan:   A preventative services visit was completed today.  During the course of the visit today, we discussed and counseled about appropriate screening and preventive services.  A health risk assessment was established today that included a review of current medications, allergies, social history, family history, medical and preventative health history, biometrics, and preventative screenings to identify potential safety concerns or impairments.  A personalized plan was printed today for your records and use.   Personalized health advice and education was given today to reduce health risks and promote self management and wellness.  Information regarding end of life planning was discussed today.  Mini-Cog - 09/07/23 0827     Normal clock drawing test? yes    How many words correct? 3           ' Conditions/risks identified: Fall risk? None Exercise - Adequate Nutritions - Adequate ADL- normal Depression Vision beginnings of macular degeneration, close monitoring with eye doctor and vitamins Hearing- no concerns Gait - Normal  Chronic problems discussed today: ***  Acute problems discussed today: ***  Recommendations: I recommend a yearly ophthalmology/optometry visit for  glaucoma screening and eye checkup I recommended a yearly dental visit for hygiene and checkup Advanced directives - discussed nature and purpose of Advanced Directives, encouraged them to complete them if they have not done so and/or encouraged them to get [place] a copy if they have done this already. *** I recommend a screening mammogram every 1-2 years Your last colonoscopy was ***.  I recommend you have a *** Your last bone density screen was ***.  I recommend you have a repeat bone density screen ***. Your last diabetes screen was ***.    Your last lipid/cholesterol screen was ***.   Referrals today: ***  Immunizations: I recommended a yearly influenza vaccine, typically in September when the vaccine is usually available Is the Pneumococcal vaccine up to date: {YES NO:22349}. Is the Shingles vaccine up to date: {YES NO:22349}.   Is the Td/Tdap vaccine up to date: {YES NO:22349}.   Medicare Attestation A preventative services visit was completed today.  During the course of the visit the patient was educated and counseled about appropriate screening and preventive services.  A health risk assessment was established with the patient that included a review of current medications, allergies, social history, family history, medical and preventative health history, biometrics, and preventative screenings to identify potential safety concerns or impairments.  A personalized plan was printed today for the patient's records and use.   Personalized health advice and education was given today to reduce health risks and promote self management and wellness.  Information regarding end of life planning was discussed today.  Tollie Eth, NP   09/07/2023

## 2023-09-08 LAB — CMP14+EGFR
ALT: 13 [IU]/L (ref 0–32)
AST: 16 [IU]/L (ref 0–40)
Albumin: 4.3 g/dL (ref 3.9–4.9)
Alkaline Phosphatase: 64 [IU]/L (ref 44–121)
BUN/Creatinine Ratio: 18 (ref 12–28)
BUN: 9 mg/dL (ref 8–27)
Bilirubin Total: 0.6 mg/dL (ref 0.0–1.2)
CO2: 25 mmol/L (ref 20–29)
Calcium: 9.4 mg/dL (ref 8.7–10.3)
Chloride: 103 mmol/L (ref 96–106)
Creatinine, Ser: 0.51 mg/dL — ABNORMAL LOW (ref 0.57–1.00)
Globulin, Total: 2.6 g/dL (ref 1.5–4.5)
Glucose: 97 mg/dL (ref 70–99)
Potassium: 4.7 mmol/L (ref 3.5–5.2)
Sodium: 140 mmol/L (ref 134–144)
Total Protein: 6.9 g/dL (ref 6.0–8.5)
eGFR: 102 mL/min/{1.73_m2} (ref >59)

## 2023-09-08 LAB — MICROSCOPIC EXAMINATION

## 2023-09-08 LAB — CBC WITH DIFFERENTIAL/PLATELET
Basophils Absolute: 0 10*3/uL (ref 0.0–0.2)
Basos: 0 %
EOS (ABSOLUTE): 0.1 10*3/uL (ref 0.0–0.4)
Eos: 2 %
Hematocrit: 41.9 % (ref 34.0–46.6)
Hemoglobin: 14 g/dL (ref 11.1–15.9)
Immature Grans (Abs): 0 10*3/uL (ref 0.0–0.1)
Immature Granulocytes: 0 %
Lymphocytes Absolute: 1.6 10*3/uL (ref 0.7–3.1)
Lymphs: 37 %
MCH: 32.2 pg (ref 26.6–33.0)
MCHC: 33.4 g/dL (ref 31.5–35.7)
MCV: 96 fL (ref 79–97)
Monocytes Absolute: 0.4 10*3/uL (ref 0.1–0.9)
Monocytes: 9 %
Neutrophils Absolute: 2.3 10*3/uL (ref 1.4–7.0)
Neutrophils: 52 %
Platelets: 259 10*3/uL (ref 150–450)
RBC: 4.35 x10E6/uL (ref 3.77–5.28)
RDW: 12.2 % (ref 11.7–15.4)
WBC: 4.5 10*3/uL (ref 3.4–10.8)

## 2023-09-08 LAB — LIPID PANEL
Chol/HDL Ratio: 3.1 {ratio} (ref 0.0–4.4)
Cholesterol, Total: 241 mg/dL — ABNORMAL HIGH (ref 100–199)
HDL: 77 mg/dL (ref >39)
LDL Chol Calc (NIH): 151 mg/dL — ABNORMAL HIGH (ref 0–99)
Triglycerides: 79 mg/dL (ref 0–149)
VLDL Cholesterol Cal: 13 mg/dL (ref 5–40)

## 2023-09-08 LAB — UA/M W/RFLX CULTURE, ROUTINE
Bilirubin, UA: NEGATIVE
Glucose, UA: NEGATIVE
Ketones, UA: NEGATIVE
Leukocytes,UA: NEGATIVE
Nitrite, UA: NEGATIVE
Protein,UA: NEGATIVE
RBC, UA: NEGATIVE
Specific Gravity, UA: 1.01 (ref 1.005–1.030)
Urobilinogen, Ur: 0.2 mg/dL (ref 0.2–1.0)
pH, UA: 6.5 (ref 5.0–7.5)

## 2023-09-08 LAB — VITAMIN B12: Vitamin B-12: 977 pg/mL (ref 232–1245)

## 2023-09-08 LAB — VITAMIN D 25 HYDROXY (VIT D DEFICIENCY, FRACTURES): Vit D, 25-Hydroxy: 27.8 ng/mL — ABNORMAL LOW (ref 30.0–100.0)

## 2023-09-09 ENCOUNTER — Encounter: Payer: Self-pay | Admitting: Nurse Practitioner

## 2023-09-09 DIAGNOSIS — J0100 Acute maxillary sinusitis, unspecified: Secondary | ICD-10-CM | POA: Insufficient documentation

## 2023-09-09 DIAGNOSIS — R3915 Urgency of urination: Secondary | ICD-10-CM | POA: Insufficient documentation

## 2023-09-09 NOTE — Assessment & Plan Note (Signed)
Expressed interest in improving leg strength. No significant concerns about hearing, but she has Jaclyn Spencer macular degeneration. Due for a mammogram and has a colonoscopy scheduled. Discussed the importance of routine screenings and preventive health measures.   - Provide leg strengthening exercises   - Schedule mammogram   - Monitor macular degeneration with vitamins   - Postpone shingles vaccine until feeling better

## 2023-09-09 NOTE — Assessment & Plan Note (Signed)
Intermittent urinary urgency, more frequent in the morning. No burning, low back pain, or significant odor to urine. Possible pelvic floor weakness contributing to symptoms. Discussed potential treatments including pelvic floor physical therapy and medications. Informed consent provided regarding the benefits of pelvic floor physical therapy and the potential side effects of medications, including dryness and irritation. Discussed the use of Premarin for vaginal dryness, which may help with urgency.   - Check urine for infection   - Consider pelvic floor physical therapy if urine test is normal   - Prescribe Premarin for vaginal dryness, which may help with urgency

## 2023-09-09 NOTE — Assessment & Plan Note (Signed)
Labs for monitoring.

## 2023-09-09 NOTE — Assessment & Plan Note (Signed)
Symptoms of a cold for over a week, potentially developing into a cough. Mild wheezing noted in the right lower lung. No ear pain or pressure, but some sinus pressure. Discussed the possibility of bacterial infection if symptoms do not improve. Informed consent provided regarding the use of antibiotics, including the potential for yeast infections and the use of Diflucan as a preventive measure.   - Prescribe antibiotic if symptoms do not improve by tomorrow   - Prescribe Diflucan to prevent potential yeast infection from antibiotic use

## 2023-09-14 ENCOUNTER — Other Ambulatory Visit: Payer: Self-pay | Admitting: Nurse Practitioner

## 2023-09-14 ENCOUNTER — Ambulatory Visit (AMBULATORY_SURGERY_CENTER): Payer: Medicare Other

## 2023-09-14 VITALS — Ht 66.0 in | Wt 129.2 lb

## 2023-09-14 DIAGNOSIS — Z860100 Personal history of colon polyps, unspecified: Secondary | ICD-10-CM

## 2023-09-14 DIAGNOSIS — E559 Vitamin D deficiency, unspecified: Secondary | ICD-10-CM

## 2023-09-14 MED ORDER — VITAMIN D (ERGOCALCIFEROL) 1.25 MG (50000 UNIT) PO CAPS: 50000.0000 [IU] | ORAL_CAPSULE | ORAL | 3 refills | Status: AC

## 2023-09-14 MED ORDER — NA SULFATE-K SULFATE-MG SULF 17.5-3.13-1.6 GM/177ML PO SOLN: 1.0000 | Freq: Once | ORAL | 0 refills | Status: AC

## 2023-09-14 NOTE — Progress Notes (Signed)

## 2023-09-15 ENCOUNTER — Telehealth: Payer: Self-pay | Admitting: Nurse Practitioner

## 2023-09-15 DIAGNOSIS — N952 Postmenopausal atrophic vaginitis: Secondary | ICD-10-CM

## 2023-09-15 NOTE — Telephone Encounter (Signed)
Pama called and states she wasn't able to pick up the Premarin due to the price but they told her she could get the generic estrace for cheaper if you are okay with sending that in.

## 2023-09-17 MED ORDER — ESTRADIOL 0.1 MG/GM VA CREA: TOPICAL_CREAM | VAGINAL | 3 refills | Status: DC

## 2023-09-17 NOTE — Telephone Encounter (Signed)
Please let her know I sent the new medication in for her.

## 2023-09-17 NOTE — Telephone Encounter (Signed)
Left message for pt to call back  °

## 2023-09-20 NOTE — Telephone Encounter (Signed)
Tried to call pt but unable to leave a message. 

## 2023-09-21 NOTE — Telephone Encounter (Signed)
Left detailed message for pt about medication being sent in

## 2023-10-06 ENCOUNTER — Other Ambulatory Visit: Payer: Self-pay | Admitting: Nurse Practitioner

## 2023-10-06 DIAGNOSIS — Z1231 Encounter for screening mammogram for malignant neoplasm of breast: Secondary | ICD-10-CM

## 2023-10-07 ENCOUNTER — Ambulatory Visit: Payer: Medicare Other | Admitting: Podiatry

## 2023-10-08 ENCOUNTER — Encounter: Payer: Self-pay | Admitting: Podiatry

## 2023-10-08 ENCOUNTER — Ambulatory Visit (INDEPENDENT_AMBULATORY_CARE_PROVIDER_SITE_OTHER): Payer: Medicare Other | Admitting: Podiatry

## 2023-10-08 DIAGNOSIS — L600 Ingrowing nail: Secondary | ICD-10-CM

## 2023-10-08 DIAGNOSIS — B49 Unspecified mycosis: Secondary | ICD-10-CM | POA: Diagnosis not present

## 2023-10-08 NOTE — Patient Instructions (Signed)

## 2023-10-08 NOTE — Progress Notes (Signed)
 Subjective:   Patient ID: Jaclyn Spencer, female   DOB: 68 y.o.   MRN: [id number]   HPI Chief Complaint  Patient presents with   Nail Problem    RM#11 problems with right big toe -- pain, may be infected or ingrown toenail. Patient states been hurting for months.   68 year old female presents the office with concerns of possible ingrown toenails both of her big toes pointing to right medial and left lateral nail border.  She states it is hard to cut the nail particularly on the right side as the skin grows underneath the.  She uses Neosporin occasionally.  No drainage or pus.  The nail borders are tender with pressure.   Review of Systems  All other systems reviewed and are negative.  Past Medical History:  Diagnosis Date   Abdominal pain    Abdominal pain, left upper quadrant 02/27/2010   Qualifier: Diagnosis of  By: Obie MD, Princella HERO    Abdominal pain, right lower quadrant 05/29/2010   Qualifier: Diagnosis of  By: Mahlon MD, Comer     Allergy    rhinitis   Anal fissure 09/04/2022   Anxiety    At high risk for tick borne illness 03/06/2022   B12 deficiency    Basal cell carcinoma    Cataract    CHEST PAIN 05/29/2010   Qualifier: Diagnosis of  By: Mahlon MD, Katherine     COVID-19 10/05/2022   Diverticulosis    Encounter for screening colonoscopy 07/16/2016   Epigastric pain 08/27/2011   Eustachian tube dysfunction 03/08/2012   Gastroenteritis 11/19/2022   General medical examination 08/27/2011   GERD (gastroesophageal reflux disease)    Insomnia 07/16/2016   Left-sided muscle weakness 07/08/2021   LLQ abdominal pain 09/14/2022   Loss of weight 02/27/2010   Qualifier: Diagnosis of   By: Obie MD, Princella HERO      Nausea 03/06/2022   Need for shingles vaccine 07/08/2021   Need for vaccination against Streptococcus pneumoniae 09/04/2022   Numbness and tingling 08/27/2011   Pain of right heel 09/04/2022   Rectal bleeding 08/17/2023   Screening mammogram for breast cancer  09/04/2022   Shoulder pain 08/27/2011   Tick bite 03/08/2012   Vitamin D  deficiency    Weight loss     Past Surgical History:  Procedure Laterality Date   COLONOSCOPY  10/10/2020   Dr. Shila   ESOPHAGOGASTRODUODENOSCOPY (EGD) WITH PROPOFOL  N/A 12/03/2020   Procedure: ESOPHAGOGASTRODUODENOSCOPY (EGD) WITH PROPOFOL ;  Surgeon: Shila Gustav GAILS, MD;  Location: [facility];  Service: Endoscopy;  Laterality: N/A;   MOHS SURGERY     nose     Current Outpatient Medications:    ALPRAZolam  (XANAX ) 0.25 MG tablet, Take 1 tablet (0.25 mg total) by mouth 2 (two) times daily as needed for anxiety., Disp: 30 tablet, Rfl: 2   AMBULATORY NON FORMULARY MEDICATION, Medication Name: Use pea sized amount of Nitroglycerin 0.125% pea sized amount per rectum three times a day for 8 weeks, Disp: 30 g, Rfl: 1   amoxicillin -clavulanate (AUGMENTIN ) 875-125 MG tablet, Take 1 tablet by mouth 2 (two) times daily. Take 1 tab every 12 hours. Take with food to avoid stomach upset. For infection., Disp: 10 tablet, Rfl: 0   Azelastine  HCl 137 MCG/SPRAY SOLN, Place 137 mcg into the nose as needed., Disp: , Rfl:    estradiol  (ESTRACE ) 0.1 MG/GM vaginal cream, 1 gram vaginally daily for one week, then 1 gram vaginally twice weekly, Disp: 42.5 g, Rfl: 3  loperamide  (IMODIUM  A-D) 2 MG tablet, Take 2 tablets at the first sign of diarrhea and 1 additional tablet with each additional stool. Max 8 pills in 24 hours., Disp: 30 tablet, Rfl: 0   loratadine  (CLARITIN ) 10 MG tablet, Take 1 tablet (10 mg total) by mouth daily., Disp: 30 tablet, Rfl: 11   meloxicam  (MOBIC ) 7.5 MG tablet, Take 1 tablet (7.5 mg total) by mouth daily., Disp: 30 tablet, Rfl: 0   Multiple Vitamins-Minerals (MULTIVITAMIN WITH MINERALS) tablet, Take 1 tablet by mouth 3 (three) times a week., Disp: , Rfl:    nifedipine  0.3 % ointment, Place 1 Application rectally 4 (four) times daily., Disp: 30 g, Rfl: 2   ondansetron  (ZOFRAN -ODT) 8 MG disintegrating  tablet, Take 1 tablet (8 mg total) by mouth every 8 (eight) hours as needed for nausea or vomiting., Disp: 30 tablet, Rfl: 1   pantoprazole  (PROTONIX ) 40 MG tablet, TAKE ONE TABLET BY MOUTH ONE TIME DAILY, Disp: 90 tablet, Rfl: 3   promethazine  (PHENERGAN ) 25 MG tablet, Take 1 tablet (25 mg total) by mouth every 6 (six) hours as needed for nausea or vomiting., Disp: 30 tablet, Rfl: 3   Vitamin D , Ergocalciferol , (DRISDOL ) 1.25 MG (50000 UNIT) CAPS capsule, Take 1 capsule (50,000 Units total) by mouth every 7 (seven) days., Disp: 12 capsule, Rfl: 3  No Known Allergies        Objective:  Physical Exam  General: AAO x3, NAD  Dermatological: Incurvation present along the right medial and left lateral nail borders tenderness palpation.  There is no drainage or pus or cellulitis or signs of infection noted today.  No open lesions.  Tenderness to the nail borders.  Hallux toenail right side more than left is hypertrophic and dystrophic with subungual debris present.  Vascular: Dorsalis Pedis artery and Posterior Tibial artery pedal pulses are 2/4 bilateral with immedate capillary fill time.  There is no pain with calf compression, swelling, warmth, erythema.   Neruologic: Grossly intact via light touch bilateral.  Musculoskeletal: Aside from the ingrown toenails no other areas of discomfort.    Assessment:   Bilateral hallux ingrown toenail, onychomycosis      Plan:  -Treatment options discussed including all alternatives, risks, and complications -Etiology of symptoms were discussed -At this time, the patient is requesting partial nail removal with chemical matricectomy to the symptomatic portion of the nail. Risks and complications were discussed with the patient for which they understand and written consent was obtained. Under sterile conditions a total of 3 mL of a mixture of 2% lidocaine  plain and 0.5% Marcaine plain was infiltrated in a hallux block fashion. Once anesthetized, the skin  was prepped in sterile fashion. A tourniquet was then applied. Next the right medial and left lateral aspect of hallux nail border was then sharply excised making sure to remove the entire offending nail border. Once the nails were ensured to be removed area was debrided and the underlying skin was intact. There is no purulence identified in the procedure. Next phenol was then applied under standard conditions and copiously irrigated. Silvadene was applied. A dry sterile dressing was applied. After application of the dressing the tourniquet was removed and there is found to be an immediate capillary refill time to the digit. The patient tolerated the procedure well any complications. Post procedure instructions were discussed the patient for which he verbally understood. Discussed signs/symptoms of infection and directed to call the office immediately should any occur or go directly to the emergency room. In  the meantime, encouraged to call the office with any questions, concerns, changes symptoms. -Nail sent for culture   Donnice JONELLE Fees DPM

## 2023-10-12 ENCOUNTER — Ambulatory Visit: Payer: Medicare Other | Admitting: Gastroenterology

## 2023-10-12 ENCOUNTER — Encounter: Payer: Self-pay | Admitting: Gastroenterology

## 2023-10-12 ENCOUNTER — Other Ambulatory Visit: Payer: Self-pay

## 2023-10-12 VITALS — BP 138/74 | HR 66 | Temp 97.7°F | Resp 14 | Ht 66.0 in | Wt 129.2 lb

## 2023-10-12 DIAGNOSIS — K5730 Diverticulosis of large intestine without perforation or abscess without bleeding: Secondary | ICD-10-CM

## 2023-10-12 DIAGNOSIS — K648 Other hemorrhoids: Secondary | ICD-10-CM

## 2023-10-12 DIAGNOSIS — Z1211 Encounter for screening for malignant neoplasm of colon: Secondary | ICD-10-CM

## 2023-10-12 DIAGNOSIS — Z860100 Personal history of colon polyps, unspecified: Secondary | ICD-10-CM

## 2023-10-12 DIAGNOSIS — K644 Residual hemorrhoidal skin tags: Secondary | ICD-10-CM

## 2023-10-12 DIAGNOSIS — D128 Benign neoplasm of rectum: Secondary | ICD-10-CM | POA: Diagnosis not present

## 2023-10-12 MED ORDER — SODIUM CHLORIDE 0.9 % IV SOLN: 500.0000 mL | Freq: Once | INTRAVENOUS | Status: DC

## 2023-10-12 NOTE — Patient Instructions (Addendum)
 Handout provided on polyps, diverticulosis, hemorrhoids and hemorrhoid banding.   Resume previous diet.  Continue present medications.  Await pathology results.  Repeat colonoscopy in 1 year for surveillance based on pathology results.  My office will contact you for hemorrhoid banding appointment.   YOU HAD AN ENDOSCOPIC PROCEDURE TODAY AT THE [facility]:   Refer to the procedure report that was given to you for any specific questions about what was found during the examination.  If the procedure report does not answer your questions, please call your gastroenterologist to clarify.  If you requested that your care partner not be given the details of your procedure findings, then the procedure report has been included in a sealed envelope for you to review at your convenience later.  YOU SHOULD EXPECT: Some feelings of bloating in the abdomen. Passage of more gas than usual.  Walking can help get rid of the air that was put into your GI tract during the procedure and reduce the bloating. If you had a lower endoscopy (such as a colonoscopy or flexible sigmoidoscopy) you may notice spotting of blood in your stool or on the toilet paper. If you underwent a bowel prep for your procedure, you may not have a normal bowel movement for a few days.  Please Note:  You might notice some irritation and congestion in your nose or some drainage.  This is from the oxygen used during your procedure.  There is no need for concern and it should clear up in a day or so.  SYMPTOMS TO REPORT IMMEDIATELY:  Following lower endoscopy (colonoscopy or flexible sigmoidoscopy):  Excessive amounts of blood in the stool  Significant tenderness or worsening of abdominal pains  Swelling of the abdomen that is new, acute  Fever of 100F or higher  For urgent or emergent issues, a gastroenterologist can be reached at any hour by calling [phone]. Do not use MyChart messaging for urgent concerns.    DIET:   We do recommend a small meal at first, but then you may proceed to your regular diet.  Drink plenty of fluids but you should avoid alcoholic beverages for 24 hours.  ACTIVITY:  You should plan to take it easy for the rest of today and you should NOT DRIVE or use heavy machinery until tomorrow (because of the sedation medicines used during the test).    FOLLOW UP: Our staff will call the number listed on your records the next business day following your procedure.  We will call around 7:15- 8:00 am to check on you and address any questions or concerns that you may have regarding the information given to you following your procedure. If we do not reach you, we will leave a message.     If any biopsies were taken you will be contacted by phone or by letter within the next 1-3 weeks.  Please call us  at [phone] if you have not heard about the biopsies in 3 weeks.    SIGNATURES/CONFIDENTIALITY: You and/or your care partner have signed paperwork which will be entered into your electronic medical record.  These signatures attest to the fact that that the information above on your After Visit Summary has been reviewed and is understood.  Full responsibility of the confidentiality of this discharge information lies with you and/or your care-partner.

## 2023-10-12 NOTE — Op Note (Addendum)
 Jaclyn Spencer Patient Name: Jaclyn Spencer Procedure Date: 10/12/2023 8:14 AM MRN: [id number] Endoscopist: Jaclyn Spencer , MD, [id number] Age: 68 Referring MD:  Date of Birth: 1956-08-06 Gender: Female Account #: [id number] Procedure:                Colonoscopy Indications:              High risk colon cancer surveillance: Personal                            history of colonic polyps, High risk colon cancer                            surveillance: Personal history of adenoma (10 mm or                            greater in size) Medicines:                Monitored Anesthesia Care Procedure:                Pre-Anesthesia Assessment:                           - Prior to the procedure, a History and Physical                            was performed, and patient medications and                            allergies were reviewed. The patient's tolerance of                            previous anesthesia was also reviewed. The risks                            and benefits of the procedure and the sedation                            options and risks were discussed with the patient.                            All questions were answered, and informed consent                            was obtained. Prior Anticoagulants: The patient has                            taken no anticoagulant or antiplatelet agents. ASA                            Grade Assessment: II - A patient with mild systemic                            disease. After reviewing the risks and benefits,  the patient was deemed in satisfactory condition to                            undergo the procedure.                           After obtaining informed consent, the colonoscope                            was passed under direct vision. Throughout the                            procedure, the patient's blood pressure, pulse, and                            oxygen saturations were monitored  continuously. The                            Olympus Scope SN [id number] was introduced through the                            anus and advanced to the the cecum, identified by                            appendiceal orifice and ileocecal valve. The                            colonoscopy was performed without difficulty. The                            patient tolerated the procedure well. The quality                            of the bowel preparation was good. The ileocecal                            valve, appendiceal orifice, and rectum were                            photographed. Scope In: 8:30:14 AM Scope Out: 8:59:34 AM Scope Withdrawal Time: 0 hours 20 minutes 44 seconds  Total Procedure Duration: 0 hours 29 minutes 20 seconds  Findings:                 The perianal and digital rectal examinations were                            normal.                           A 20 mm polyp was found in the rectum. The polyp                            was sessile. The polyp was removed with a piecemeal  technique using a hot snare. Resection and                            retrieval were complete.                           A few small-mouthed diverticula were found in the                            sigmoid colon.                           Non-bleeding external and internal hemorrhoids were                            found during retroflexion. The hemorrhoids were                            medium-sized. Complications:            No immediate complications. Estimated Blood Loss:     Estimated blood loss was minimal. Impression:               - One 20 mm polyp in the rectum, removed piecemeal                            using a hot snare. Resected and retrieved.                           - Diverticulosis in the sigmoid colon.                           - Non-bleeding external and internal hemorrhoids. Recommendation:           - Patient has a contact number available for                             emergencies. The signs and symptoms of potential                            delayed complications were discussed with the                            patient. Return to normal activities tomorrow.                            Written discharge instructions were provided to the                            patient.                           - Resume previous diet.                           - Continue present medications.                           -  Await pathology results.                           - Repeat colonoscopy in 1 year for surveillance                            based on pathology results.                           - Return to GI clinic at appointment to be                            scheduled for hemorrhoidal band ligation. Jaclyn Farrel V. Genae Strine, MD 10/12/2023 9:08:33 AM This report has been signed electronically.

## 2023-10-12 NOTE — Progress Notes (Signed)
 Vss nad trans to pacu

## 2023-10-12 NOTE — Progress Notes (Signed)
 Called to room to assist during endoscopic procedure.  Patient ID and intended procedure confirmed with present staff. Received instructions for my participation in the procedure from the performing physician.

## 2023-10-12 NOTE — Progress Notes (Signed)
 [facility] History and Physical   Primary Care Physician:  Early, Camie BRAVO, NP   Reason for Procedure:  History of adenomatous colon polyps  Plan:    Surveillance colonoscopy with possible interventions as needed     HPI: Jaclyn Spencer is a very pleasant 68 y.o. female here for surveillance colonoscopy. Denies any nausea, vomiting, abdominal pain, melena or bright red blood per rectum  The risks and benefits as well as alternatives of endoscopic procedure(s) have been discussed and reviewed. All questions answered. The patient agrees to proceed.    Past Medical History:  Diagnosis Date   Abdominal pain    Abdominal pain, left upper quadrant 02/27/2010   Qualifier: Diagnosis of  By: Obie MD, Princella HERO    Abdominal pain, right lower quadrant 05/29/2010   Qualifier: Diagnosis of  By: Mahlon MD, Com[place]     Allergy    rhinitis   Anal fissure 09/04/2022   Anxiety    At high risk for tick borne illness 03/06/2022   B12 deficiency    Basal cell carcinoma    Cataract    CHEST PAIN 05/29/2010   Qualifier: Diagnosis of  By: Mahlon MD, Katherine     COVID-19 10/05/2022   Diverticulosis    Encounter for screening colonoscopy 07/16/2016   Epigastric pain 08/27/2011   Eustachian tube dysfunction 03/08/2012   Gastroenteritis 11/19/2022   General medical examination 08/27/2011   GERD (gastroesophageal reflux disease)    Insomnia 07/16/2016   Left-sided muscle weakness 07/08/2021   LLQ abdominal pain 09/14/2022   Loss of weight 02/27/2010   Qualifier: Diagnosis of   By: Obie MD, Princella HERO      Nausea 03/06/2022   Need for shingles vaccine 07/08/2021   Need for vaccination against Streptococcus pneumoniae 09/04/2022   Numbness and tingling 08/27/2011   Pain of right heel 09/04/2022   Rectal bleeding 08/17/2023   Screening mammogram for breast cancer 09/04/2022   Shoulder pain 08/27/2011   Tick bite 03/08/2012   Vitamin D  deficiency    Weight loss     Past  Surgical History:  Procedure Laterality Date   COLONOSCOPY  10/10/2020   Dr. Shila   ESOPHAGOGASTRODUODENOSCOPY (EGD) WITH PROPOFOL  N/A 12/03/2020   Procedure: ESOPHAGOGASTRODUODENOSCOPY (EGD) WITH PROPOFOL ;  Surgeon: Shila Gustav GAILS, MD;  Location: [facility];  Service: Endoscopy;  Laterality: N/A;   MOHS SURGERY     nose    Prior to Admission medications   Medication Sig Start Date End Date Taking? Authorizing Provider  estradiol  (ESTRACE ) 0.1 MG/GM vaginal cream 1 gram vaginally daily for one week, then 1 gram vaginally twice weekly 09/17/23  Yes Early, Sara E, NP  loratadine  (CLARITIN ) 10 MG tablet Take 1 tablet (10 mg total) by mouth daily. 11/10/21  Yes Early, Sara E, NP  meloxicam  (MOBIC ) 7.5 MG tablet Take 1 tablet (7.5 mg total) by mouth daily. 09/14/22  Yes Early, Sara E, NP  Multiple Vitamins-Minerals (MULTIVITAMIN WITH MINERALS) tablet Take 1 tablet by mouth 3 (three) times a week.   Yes [provider]  pantoprazole  (PROTONIX ) 40 MG tablet TAKE ONE TABLET BY MOUTH ONE TIME DAILY 07/23/23  Yes Craig Palma R, PA-C  Vitamin D , Ergocalciferol , (DRISDOL ) 1.25 MG (50000 UNIT) CAPS capsule Take 1 capsule (50,000 Units total) by mouth every 7 (seven) days. 09/14/23  Yes Early, Sara E, NP  ALPRAZolam  (XANAX ) 0.25 MG tablet Take 1 tablet (0.25 mg total) by mouth 2 (two) times daily as needed for anxiety. 08/10/23 08/09/24  Early, Sara E, NP  AMBULATORY NON FORMULARY MEDICATION Medication Name: Use pea sized amount of Nitroglycerin 0.125% pea sized amount per rectum three times a day for 8 weeks 04/08/23   Dvante Hands V, MD  Azelastine  HCl 137 MCG/SPRAY SOLN Place 137 mcg into the nose as needed. Patient not taking: Reported on 10/12/2023 01/11/18   [provider]  loperamide  (IMODIUM  A-D) 2 MG tablet Take 2 tablets at the first sign of diarrhea and 1 additional tablet with each additional stool. Max 8 pills in 24 hours. 11/19/22   Early, Sara E, NP   nifedipine  0.3 % ointment Place 1 Application rectally 4 (four) times daily. 08/10/23   Early, Sara E, NP  ondansetron  (ZOFRAN -ODT) 8 MG disintegrating tablet Take 1 tablet (8 mg total) by mouth every 8 (eight) hours as needed for nausea or vomiting. 08/10/23   Early, Sara E, NP  promethazine  (PHENERGAN ) 25 MG tablet Take 1 tablet (25 mg total) by mouth every 6 (six) hours as needed for nausea or vomiting. 08/10/23   Early, Sara E, NP    Current Outpatient Medications  Medication Sig Dispense Refill   estradiol  (ESTRACE ) 0.1 MG/GM vaginal cream 1 gram vaginally daily for one week, then 1 gram vaginally twice weekly 42.5 g 3   loratadine  (CLARITIN ) 10 MG tablet Take 1 tablet (10 mg total) by mouth daily. 30 tablet 11   meloxicam  (MOBIC ) 7.5 MG tablet Take 1 tablet (7.5 mg total) by mouth daily. 30 tablet 0   Multiple Vitamins-Minerals (MULTIVITAMIN WITH MINERALS) tablet Take 1 tablet by mouth 3 (three) times a week.     pantoprazole  (PROTONIX ) 40 MG tablet TAKE ONE TABLET BY MOUTH ONE TIME DAILY 90 tablet 3   Vitamin D , Ergocalciferol , (DRISDOL ) 1.25 MG (50000 UNIT) CAPS capsule Take 1 capsule (50,000 Units total) by mouth every 7 (seven) days. 12 capsule 3   ALPRAZolam  (XANAX ) 0.25 MG tablet Take 1 tablet (0.25 mg total) by mouth 2 (two) times daily as needed for anxiety. 30 tablet 2   AMBULATORY NON FORMULARY MEDICATION Medication Name: Use pea sized amount of Nitroglycerin 0.125% pea sized amount per rectum three times a day for 8 weeks 30 g 1   Azelastine  HCl 137 MCG/SPRAY SOLN Place 137 mcg into the nose as needed. (Patient not taking: Reported on 10/12/2023)     loperamide  (IMODIUM  A-D) 2 MG tablet Take 2 tablets at the first sign of diarrhea and 1 additional tablet with each additional stool. Max 8 pills in 24 hours. 30 tablet 0   nifedipine  0.3 % ointment Place 1 Application rectally 4 (four) times daily. 30 g 2   ondansetron  (ZOFRAN -ODT) 8 MG disintegrating tablet Take 1 tablet (8 mg  total) by mouth every 8 (eight) hours as needed for nausea or vomiting. 30 tablet 1   promethazine  (PHENERGAN ) 25 MG tablet Take 1 tablet (25 mg total) by mouth every 6 (six) hours as needed for nausea or vomiting. 30 tablet 3   Current Facility-Administered Medications  Medication Dose Route Frequency Provider Last Rate Last Admin   0.9 %  sodium chloride  infusion  500 mL Intravenous Once Johnthan Axtman V, MD        Allergies as of 10/12/2023   (No Known Allergies)    Family History  Problem Relation Age of Onset   Colitis Mother    Inflammatory bowel disease Mother    Coronary artery disease Mother 74       Stents   Heart attack Mother  Kidney cancer Mother    Lung cancer Father    Arthritis Sister    Cancer Brother        melanoma   Stomach cancer Paternal Aunt    Stomach cancer Paternal Grandmother    Coronary artery disease Paternal Grandfather    Diabetes Neg Hx    Hyperlipidemia Neg Hx    Hypertension Neg Hx    Sudden death Neg Hx    Esophageal cancer Neg Hx    Pancreatic cancer Neg Hx    Liver disease Neg Hx    Colon cancer Neg Hx    Rectal cancer Neg Hx    Colon polyps Neg Hx     Social History   Socioeconomic History   Marital status: Married    Spouse name: Not on file   Number of children: 0   Years of education: Not on file   Highest education level: Not on file  Occupational History   Not on file  Tobacco Use   Smoking status: Former    Current packs/day: 0.00    Types: Cigarettes    Quit date: 09/28/1976    Years since quitting: 47.0   Smokeless tobacco: Never  Vaping Use   Vaping status: Never Used  Substance and Sexual Activity   Alcohol use: Yes    Comment: occ   Drug use: No   Sexual activity: Not Currently  Other Topics Concern   Not on file  Social History Narrative   2 step daughters and 1 step son   Social Drivers of Corporate Investment Banker Strain: Low Risk  (09/07/2023)   Overall Financial Resource Strain (CARDIA)     Difficulty of Paying Living Expenses: Not hard at all  Food Insecurity: No Food Insecurity (09/07/2023)   Hunger Vital Sign    Worried About Running Out of Food in the Last Year: Never true    Ran Out of Food in the Last Year: Never true  Transportation Needs: No Transportation Needs (09/07/2023)   PRAPARE - Administrator, Civil Service (Medical): No    Lack of Transportation (Non-Medical): No  Physical Activity: Insufficiently Active (09/07/2023)   Exercise Vital Sign    Days of Exercise per Week: 5 days    Minutes of Exercise per Session: 20 min  Stress: No Stress Concern Present (09/07/2023)   [employer] of Occupational Health - Occupational Stress Questionnaire    Feeling of Stress : Only a little  Social Connections: Socially Integrated (09/07/2023)   Social Connection and Isolation Panel [NHANES]    Frequency of Communication with Friends and Family: Once a week    Frequency of Social Gatherings with Friends and Family: Three times a week    Attends Religious Services: More than 4 times per year    Active Member of Clubs or Organizations: Yes    Attends Banker Meetings: Patient unable to answer    Marital Status: Married  Catering Manager Violence: Not At Risk (09/07/2023)   Humiliation, Afraid, Rape, and Kick questionnaire    Fear of Current or Ex-Partner: No    Emotionally Abused: No    Physically Abused: No    Sexually Abused: No    Review of Systems:  All other review of systems negative except as mentioned in the HPI.  Physical Exam: Vital signs in last 24 hours: BP (!) 143/92   Pulse 85   Temp 97.7 F (36.5 C)   Ht 5' 6 (1.676 m)  Wt 129 lb 3.2 oz (58.6 kg)   SpO2 98%   BMI 20.85 kg/m  General:   Alert, NAD Lungs:  Clear .   Heart:  Regular rate and rhythm Abdomen:  Soft, nontender and nondistended. Neuro/Psych:  Alert and cooperative. Normal mood and affect. A and O x 3  Reviewed labs, radiology imaging, old  records and pertinent past GI work up  Patient is appropriate for planned procedure(s) and anesthesia in an ambulatory setting   K. Veena Esvin Hnat , MD [phone]

## 2023-10-12 NOTE — Progress Notes (Signed)
 Pt's states no medical or surgical changes since previsit or office visit.

## 2023-10-13 ENCOUNTER — Telehealth: Payer: Self-pay | Admitting: *Deleted

## 2023-10-13 NOTE — Telephone Encounter (Signed)
 Left message on f/u call

## 2023-10-14 LAB — SURGICAL PATHOLOGY

## 2023-10-15 ENCOUNTER — Ambulatory Visit
Admission: RE | Admit: 2023-10-15 | Discharge: 2023-10-15 | Disposition: A | Discharge status: Home / Self Care | Payer: Medicare Other | Source: Ambulatory Visit

## 2023-10-15 DIAGNOSIS — Z1231 Encounter for screening mammogram for malignant neoplasm of breast: Secondary | ICD-10-CM

## 2023-10-18 ENCOUNTER — Other Ambulatory Visit: Payer: Self-pay | Admitting: Podiatry

## 2023-10-19 ENCOUNTER — Ambulatory Visit (INDEPENDENT_AMBULATORY_CARE_PROVIDER_SITE_OTHER): Payer: Medicare Other | Admitting: Podiatry

## 2023-10-19 ENCOUNTER — Encounter: Payer: Self-pay | Admitting: Podiatry

## 2023-10-19 DIAGNOSIS — L600 Ingrowing nail: Secondary | ICD-10-CM

## 2023-10-19 DIAGNOSIS — L03032 Cellulitis of left toe: Secondary | ICD-10-CM | POA: Diagnosis not present

## 2023-10-19 MED ORDER — CEPHALEXIN 500 MG PO CAPS: 500.0000 mg | ORAL_CAPSULE | Freq: Four times a day (QID) | ORAL | 0 refills | Status: DC

## 2023-10-19 MED ORDER — TAVABOROLE 5 % EX SOLN: 1.0000 [drp] | Freq: Every day | CUTANEOUS | 2 refills | Status: DC

## 2023-10-19 MED ORDER — MUPIROCIN 2 % EX OINT
1.0000 | TOPICAL_OINTMENT | Freq: Two times a day (BID) | CUTANEOUS | 2 refills | Status: DC
Start: 2023-10-19 — End: 2023-11-03

## 2023-10-19 NOTE — Patient Instructions (Signed)

## 2023-10-19 NOTE — Progress Notes (Unsigned)
Subjective: Chief Complaint  Patient presents with   Nail Problem    RM#11 Patient states had ingrown nail removed left foot big toe and has puss draining.Started on Friday and has been getting worse.   68 year old female presents the office the above concerns.  She states after her second soaking and using Neosporin she started noticing some drainage, possibly her left great toe.  She has little bit better more in the right side.  She still soaking Epsom salts with no little burning.  Objective: AAO x3, NAD DP/PT pulses palpable bilaterally, CRT less than 3 seconds Status post partial nail avulsions bilateral hallux.  There is some localized edema and erythema along the toenail with the corners removed.  There is no ascending cellulitis.  On the left side there is small amount of clear purulent drainage noted.  There is no fluctuation or crepitation. No pain with calf compression, swelling, warmth, erythema  Assessment: Status post partial nail avulsion with localized infection  Plan: -All treatment options discussed with the patient including all alternatives, risks, complications.  -Recommend continue soaking Epsom salts will forward a burn warmth, soapy water.  Prescribing Pearson ointment to apply daily followed by bandage.  Continue soaking change the bandage twice a day.  Prescribed Keflex -Monitor for any clinical signs or symptoms of infection and directed to call the office immediately should any occur or go to the ER. -Patient encouraged to call the office with any questions, concerns, change in symptoms.   RTC 2 weeks, or sooner if needed.   Vivi Barrack DPM

## 2023-10-22 ENCOUNTER — Ambulatory Visit: Payer: Medicare Other | Admitting: Podiatry

## 2023-10-28 ENCOUNTER — Encounter: Payer: Self-pay | Admitting: Gastroenterology

## 2023-10-29 ENCOUNTER — Telehealth: Payer: Self-pay | Admitting: Nurse Practitioner

## 2023-10-29 ENCOUNTER — Other Ambulatory Visit: Payer: Self-pay | Admitting: Nurse Practitioner

## 2023-10-29 DIAGNOSIS — M545 Low back pain, unspecified: Secondary | ICD-10-CM

## 2023-10-29 DIAGNOSIS — B3731 Acute candidiasis of vulva and vagina: Secondary | ICD-10-CM

## 2023-10-29 MED ORDER — FLUCONAZOLE 150 MG PO TABS: ORAL_TABLET | ORAL | 2 refills | Status: DC

## 2023-10-29 MED ORDER — MELOXICAM 7.5 MG PO TABS
7.5000 mg | ORAL_TABLET | Freq: Every day | ORAL | 0 refills | Status: AC
Start: 2023-10-29 — End: ?

## 2023-10-29 NOTE — Telephone Encounter (Signed)
Pt called and is having back issues again and wants refill on meloxicam  She has appt Wednesday for yeast inf, she has tried 3 day monistat but not helping wanted to know if you had any other recommendations. Was recently on antibiotics for toe infection

## 2023-11-02 ENCOUNTER — Encounter: Payer: Self-pay | Admitting: Podiatry

## 2023-11-02 ENCOUNTER — Ambulatory Visit (INDEPENDENT_AMBULATORY_CARE_PROVIDER_SITE_OTHER): Payer: Medicare Other | Admitting: Podiatry

## 2023-11-02 DIAGNOSIS — L03032 Cellulitis of left toe: Secondary | ICD-10-CM

## 2023-11-02 DIAGNOSIS — L600 Ingrowing nail: Secondary | ICD-10-CM | POA: Diagnosis not present

## 2023-11-02 NOTE — Patient Instructions (Signed)
 Continue soaking in epsom salts twice a day followed by antibiotic ointment and a band-aid. Can leave uncovered at night. Continue this until completely healed.  If the area has not healed in 2 weeks, call the office for follow-up appointment, or sooner if any problems arise.  Monitor for any signs/symptoms of infection. Call the office immediately if any occur or go directly to the emergency room. Call with any questions/concerns.

## 2023-11-02 NOTE — Progress Notes (Signed)
 Subjective: Chief Complaint  Patient presents with   Nail Problem    RM#11 bilateral big toe nail infection follow up still some redness also nail trim.    68 year old female presents the office the above concerns.  She states her toes are doing better.  Some and some redness but overall looking much better no drainage or pus.  She thinks that the other side of the right big nail starting to get ingrown causing some discomfort.  No drainage or pus in this area.  No other concerns.   Objective: AAO x3, NAD DP/PT pulses palpable bilaterally, CRT less than 3 seconds Status post partial nail avulsions bilateral hallux.  There is still some about edema erythema base of the nail corner however appears to be improved.  No drainage or pus identified today.  There is some callus, dry skin formation on the side of the toenail which I debrided today as well.  There is not appear to be any significant incurvation along the nail corners that were removed.  Mild incurvation of the lateral aspect of right hallux toenail.  No drainage or pus. No pain with calf compression, swelling, warmth, erythema  Assessment: Status post partial nail avulsion with localized infection-improving  Plan: -All treatment options discussed with the patient including all alternatives, risks, complications.  -Today I debrided the hallux toenails and complications I also debrided some of the loose, callused skin today without any complications.  Recommend continue soaking Epsom salts, antibiotic ointment and a bandage in the day but can leave the area open at nighttime.  I would continue until the symptoms have resolved.  If they do not fully resolve or if they were to recur may need to have exploration to make sure no further residual nail or skin is present.  However I expect this to continue to improve as it already looks better compared to last appointment.   Return for 2-3 weeks for ingrown toenail.  Donnice JONELLE Fees DPM

## 2023-11-03 ENCOUNTER — Ambulatory Visit (INDEPENDENT_AMBULATORY_CARE_PROVIDER_SITE_OTHER): Payer: Medicare Other | Admitting: Medical

## 2023-11-03 ENCOUNTER — Encounter: Payer: Self-pay | Admitting: Gastroenterology

## 2023-11-03 ENCOUNTER — Ambulatory Visit: Payer: Medicare Other | Attending: Medical

## 2023-11-03 VITALS — BP 118/74 | HR 79 | Temp 97.7°F | Wt 129.4 lb

## 2023-11-03 DIAGNOSIS — M542 Cervicalgia: Secondary | ICD-10-CM

## 2023-11-03 DIAGNOSIS — M549 Dorsalgia, unspecified: Secondary | ICD-10-CM | POA: Diagnosis not present

## 2023-11-03 DIAGNOSIS — R002 Palpitations: Secondary | ICD-10-CM

## 2023-11-03 NOTE — Progress Notes (Signed)
I have placed a referred

## 2023-11-03 NOTE — Progress Notes (Unsigned)
 EP to read.

## 2023-11-03 NOTE — Progress Notes (Signed)
 Subjective:  Jaclyn Spencer is a 68 y.o. female who presents for Chief Complaint  Patient presents with   Back Pain    Back pain that is going up neck.      Here for back pain.  She called today for refill on meloxicam .  A week ago was sitting, meditating, and felt a warm pain in upper back.   As the day went on had stabbing pains if leaning back or certain motions .  That got some better the next day but still getting some upper back and some neck pains.  Has had back issues before but not often.  No significant pain today.  Having some elevated heart rate at times and palpitations.  This has happened about 6 times in the last 2 weeks when she gets the palpitations she feels kind of awful briefly, fatigue, short of breath.  No confusion, no slurred speech, no syncope, no dizziness  She denies heavy alcohol use or caffeine use.  She does drink occasional glass of alcohol.  She has been stressed with the recent election results and thinks that may have something to the palpitations  When she made the appointment last week she was having concerns for yeast infection but that resolved  No other aggravating or relieving factors.    No other c/o.  Past Medical History:  Diagnosis Date   Abdominal pain    Abdominal pain, left upper quadrant 02/27/2010   Qualifier: Diagnosis of  By: Obie MD, Princella HERO    Abdominal pain, right lower quadrant 05/29/2010   Qualifier: Diagnosis of  By: Mahlon MD, Comer     Allergy    rhinitis   Anal fissure 09/04/2022   Anxiety    At high risk for tick borne illness 03/06/2022   B12 deficiency    Basal cell carcinoma    Cataract    CHEST PAIN 05/29/2010   Qualifier: Diagnosis of  By: Mahlon MD, Katherine     COVID-19 10/05/2022   Diverticulosis    Encounter for screening colonoscopy 07/16/2016   Epigastric pain 08/27/2011   Eustachian tube dysfunction 03/08/2012   Gastroenteritis 11/19/2022   General medical examination 08/27/2011   GERD  (gastroesophageal reflux disease)    Insomnia 07/16/2016   Left-sided muscle weakness 07/08/2021   LLQ abdominal pain 09/14/2022   Loss of weight 02/27/2010   Qualifier: Diagnosis of   By: Obie MD, Princella HERO      Nausea 03/06/2022   Need for shingles vaccine 07/08/2021   Need for vaccination against Streptococcus pneumoniae 09/04/2022   Numbness and tingling 08/27/2011   Pain of right heel 09/04/2022   Rectal bleeding 08/17/2023   Screening mammogram for breast cancer 09/04/2022   Shoulder pain 08/27/2011   Tick bite 03/08/2012   Vitamin D  deficiency    Weight loss    Current Outpatient Medications on File Prior to Visit  Medication Sig Dispense Refill   ALPRAZolam  (XANAX ) 0.25 MG tablet Take 1 tablet (0.25 mg total) by mouth 2 (two) times daily as needed for anxiety. 30 tablet 2   Azelastine  HCl 137 MCG/SPRAY SOLN Place 137 mcg into the nose as needed.     estradiol  (ESTRACE ) 0.1 MG/GM vaginal cream 1 gram vaginally daily for one week, then 1 gram vaginally twice weekly 42.5 g 3   loratadine  (CLARITIN ) 10 MG tablet Take 1 tablet (10 mg total) by mouth daily. 30 tablet 11   Multiple Vitamins-Minerals (MULTIVITAMIN WITH MINERALS) tablet Take 1 tablet by mouth 3 (  three) times a week.     pantoprazole  (PROTONIX ) 40 MG tablet TAKE ONE TABLET BY MOUTH ONE TIME DAILY 90 tablet 3   Tavaborole  (KERYDIN ) 5 % SOLN Apply 1 drop topically daily. Apply 1 drop to the toenail daily. 10 mL 2   Vitamin D , Ergocalciferol , (DRISDOL ) 1.25 MG (50000 UNIT) CAPS capsule Take 1 capsule (50,000 Units total) by mouth every 7 (seven) days. 12 capsule 3   AMBULATORY NON FORMULARY MEDICATION Medication Name: Use pea sized amount of Nitroglycerin 0.125% pea sized amount per rectum three times a day for 8 weeks 30 g 1   fluconazole  (DIFLUCAN ) 150 MG tablet Take one tablet by mouth today. May repeat in 3 days if symptoms not resolved (Patient not taking: Reported on 11/03/2023) 2 tablet 2   loperamide  (IMODIUM  A-D) 2 MG  tablet Take 2 tablets at the first sign of diarrhea and 1 additional tablet with each additional stool. Max 8 pills in 24 hours. (Patient not taking: Reported on 11/03/2023) 30 tablet 0   meloxicam  (MOBIC ) 7.5 MG tablet Take 1 tablet (7.5 mg total) by mouth daily. (Patient not taking: Reported on 11/03/2023) 30 tablet 0   No current facility-administered medications on file prior to visit.      The following portions of the patient's history were reviewed and updated as appropriate: allergies, current medications, past family history, past medical history, past social history, past surgical history and problem list.  ROS Otherwise as in subjective above    Objective: BP 118/74   Pulse 79   Temp 97.7 F (36.5 C)   Wt 129 lb 6.4 oz (58.7 kg)   BMI 20.89 kg/m   General appearance: alert, no distress, well developed, well nourished Neck: supple, no lymphadenopathy, no thyromegaly, no masses, supple, nontender, normal range of motion There is some mild tenderness in the left upper back paraspinal region and slight midline tenderness as well in the same area but otherwise back nontender and no obvious deformity, range of motion normal MSK: Upper extremities unremarkable Heart: RRR, normal S1, S2, no murmurs Lungs: CTA bilaterally, no wheezes, rhonchi, or rales Pulses: 2+ radial pulses, 2+ pedal pulses, normal cap refill Ext: no edema Arm strength normal  EKG reviewed, normal    Assessment: Encounter Diagnoses  Name Primary?   Palpitation Yes   Neck pain    Upper back pain      Plan: Palpitation-we discussed possible causes.  I reviewed blood work she had done in December, normal electrolytes and blood counts.  Will check a TSH today.  We will also set her up for a ZIO event monitor x 1 week.  Neck pain and upper back pain-we discussed her symptoms.  Exam mostly unremarkable today but mildly tender in the left upper back.  Offered baseline x-ray but she declines for now.  She  will work on stretching and exercise.  She uses meloxicam  occasionally.  If symptoms progressively get worse particular with radicular symptoms as discussed then recheck her plan to do x-rays of neck and upper back  Anndrea was seen today for back pain.  Diagnoses and all orders for this visit:  Palpitation -     TSH + free T4 -     EKG 12-Lead -     LONG TERM MONITOR (3-14 DAYS); Future  Neck pain  Upper back pain    Follow up: pending lab ,zio monitor

## 2023-11-04 LAB — TSH+FREE T4
Free T4: 1.16 ng/dL (ref 0.82–1.77)
TSH: 1.25 u[IU]/mL (ref 0.450–4.500)

## 2023-11-04 NOTE — Progress Notes (Signed)
 Results sent through MyChart

## 2023-11-05 ENCOUNTER — Ambulatory Visit: Payer: Medicare Other | Admitting: Podiatry

## 2023-11-08 DIAGNOSIS — R002 Palpitations: Secondary | ICD-10-CM

## 2023-11-12 ENCOUNTER — Ambulatory Visit (INDEPENDENT_AMBULATORY_CARE_PROVIDER_SITE_OTHER): Payer: Medicare Other | Admitting: Gastroenterology

## 2023-11-12 ENCOUNTER — Encounter: Payer: Self-pay | Admitting: Gastroenterology

## 2023-11-12 VITALS — BP 122/80 | HR 73 | Ht 66.0 in | Wt 131.0 lb

## 2023-11-12 DIAGNOSIS — K219 Gastro-esophageal reflux disease without esophagitis: Secondary | ICD-10-CM | POA: Diagnosis not present

## 2023-11-12 DIAGNOSIS — Z860101 Personal history of adenomatous and serrated colon polyps: Secondary | ICD-10-CM | POA: Diagnosis not present

## 2023-11-12 DIAGNOSIS — K220 Achalasia of cardia: Secondary | ICD-10-CM

## 2023-11-12 DIAGNOSIS — K649 Unspecified hemorrhoids: Secondary | ICD-10-CM | POA: Diagnosis not present

## 2023-11-12 DIAGNOSIS — K641 Second degree hemorrhoids: Secondary | ICD-10-CM

## 2023-11-12 DIAGNOSIS — K2100 Gastro-esophageal reflux disease with esophagitis, without bleeding: Secondary | ICD-10-CM

## 2023-11-12 NOTE — Patient Instructions (Signed)
VISIT SUMMARY:  Today, we reviewed your recent health concerns following the removal of a precancerous colon polyp. We discussed your ongoing issues with swallowing and gastroesophageal reflux disease (GERD), as well as your history of hemorrhoids. We have outlined a plan to monitor and manage these conditions moving forward.  YOUR PLAN:  -COLORECTAL POLYP (PRECANCEROUS): A precancerous polyp was removed from your rectum, which has alleviated the pressure and discomfort you were experiencing. This removal reduces your risk of developing colorectal cancer. It is important to have follow-up colonoscopies to monitor for any recurrence. Your next colonoscopy should be scheduled for January 2026. Please monitor for any symptoms such as pain or bleeding and return if they occur.  -HEMORRHOIDS: You have hemorrhoids, but they are not currently causing any symptoms. Treatment is only necessary if you experience symptoms like bleeding, swelling, or discomfort. If symptoms develop, banding can be considered to remove excess tissue.  -PHARYNGEAL ACHALASIA: Pharyngeal achalasia is a condition where the upper esophageal sphincter is tight, making it difficult to swallow. We recommend a barium swallow test to further evaluate this issue. If necessary, less invasive treatments like dilation can be considered. Please monitor your symptoms and we will adjust your treatment as needed.  -GASTROESOPHAGEAL REFLUX DISEASE (GERD): GERD is a condition where stomach acid frequently flows back into the esophagus, causing irritation. You also have a small hiatal hernia. Your symptoms have improved, and you have been able to reduce your medication dosage. Continue managing your GERD with the least amount of medication needed to control your symptoms, and adjust the dosage based on how you feel.  INSTRUCTIONS:  Please schedule a follow-up colonoscopy in January 2026. Additionally, schedule a follow-up appointment with [place] in six  months. If your symptoms worsen or if you develop any new symptoms, please return earlier.

## 2023-11-12 NOTE — Progress Notes (Signed)
 Jaclyn Spencer    [id number]    08-Feb-1956  Primary Care Physician:Early, Sung Amabile, NP  Referring Physician: Tollie Eth, NP [address] [place],  [place] [address]   Chief complaint:  colon polyps  Discussed the use of AI scribe software for clinical note transcription with the patient, who gave verbal consent to proceed.  History of Present Illness   Jaclyn Spencer is a 68 year old female who presents for follow-up after removal of a precancerous colon polyp.  She had a precancerous colon polyp removed from the rectum, which was described as a good-sized lesion. The removal has resolved the deep pain she previously experienced in the rectal area and alleviated the pressure she felt, especially when there was stool in the rectum.  She has ongoing issues with swallowing and gastroesophageal reflux disease (GERD). She has a history of a tight upper esophageal sphincter and GERD, which has been somewhat alleviated since her recent colonoscopy. She has reduced her medication dosage as her symptoms have improved, noting less hoarseness and sore throats. Her GERD symptoms have been managed with medication, and she has a small hiatal hernia. She continues to manage her diet by avoiding foods that are difficult to swallow, such as steak, which she cuts into tiny pieces.  She has a history of hemorrhoids, but they are not currently causing any symptoms.      Colonoscopy 10/12/2023  - One 20 mm polyp in the rectum, removed piecemeal using a hot snare. Resected and retrieved. - Diverticulosis in the sigmoid colon. - Non-bleeding external and internal hemorrhoids 1. Surgical [P], colon, rectum, polyp (1) :       TUBULAR ADENOMA.       NEGATIVE FOR HIGH-GRADE DYSPLASIA.     Outpatient Encounter Medications as of 11/12/2023  Medication Sig   ALPRAZolam (XANAX) 0.25 MG tablet Take 1 tablet (0.25 mg total) by mouth 2 (two) times daily as needed for anxiety.   AMBULATORY NON  FORMULARY MEDICATION Medication Name: Use pea sized amount of Nitroglycerin 0.125% pea sized amount per rectum three times a day for 8 weeks   Azelastine HCl 137 MCG/SPRAY SOLN Place 137 mcg into the nose as needed.   estradiol (ESTRACE) 0.1 MG/GM vaginal cream 1 gram vaginally daily for one week, then 1 gram vaginally twice weekly   loperamide (IMODIUM A-D) 2 MG tablet Take 2 tablets at the first sign of diarrhea and 1 additional tablet with each additional stool. Max 8 pills in 24 hours.   loratadine (CLARITIN) 10 MG tablet Take 1 tablet (10 mg total) by mouth daily.   meloxicam (MOBIC) 7.5 MG tablet Take 1 tablet (7.5 mg total) by mouth daily.   Multiple Vitamins-Minerals (MULTIVITAMIN WITH MINERALS) tablet Take 1 tablet by mouth 3 (three) times a week.   pantoprazole (PROTONIX) 40 MG tablet TAKE ONE TABLET BY MOUTH ONE TIME DAILY   Tavaborole (KERYDIN) 5 % SOLN Apply 1 drop topically daily. Apply 1 drop to the toenail daily.   Vitamin D, Ergocalciferol, (DRISDOL) 1.25 MG (50000 UNIT) CAPS capsule Take 1 capsule (50,000 Units total) by mouth every 7 (seven) days.   [DISCONTINUED] fluconazole (DIFLUCAN) 150 MG tablet Take one tablet by mouth today. May repeat in 3 days if symptoms not resolved (Patient not taking: Reported on 11/03/2023)   No facility-administered encounter medications on file as of 11/12/2023.    Allergies as of 11/12/2023   (No Known Allergies)  Past Medical History:  Diagnosis Date   Abdominal pain    Abdominal pain, left upper quadrant 02/27/2010   Qualifier: Diagnosis of  By: Juanda Chance MD, Hedwig Morton    Abdominal pain, right lower quadrant 05/29/2010   Qualifier: Diagnosis of  By: Beverely Low MD, Natalia Leatherwood     Allergy    rhinitis   Anal fissure 09/04/2022   Anxiety    At high risk for tick borne illness 03/06/2022   B12 deficiency    Basal cell carcinoma    Cataract    CHEST PAIN 05/29/2010   Qualifier: Diagnosis of  By: Beverely Low MD, Katherine     COVID-19 10/05/2022    Diverticulosis    Encounter for screening colonoscopy 07/16/2016   Epigastric pain 08/27/2011   Eustachian tube dysfunction 03/08/2012   Gastroenteritis 11/19/2022   General medical examination 08/27/2011   GERD (gastroesophageal reflux disease)    Insomnia 07/16/2016   Left-sided muscle weakness 07/08/2021   LLQ abdominal pain 09/14/2022   Loss of weight 02/27/2010   Qualifier: Diagnosis of   By: Juanda Chance MD, Hedwig Morton      Nausea 03/06/2022   Need for shingles vaccine 07/08/2021   Need for vaccination against Streptococcus pneumoniae 09/04/2022   Numbness and tingling 08/27/2011   Pain of right heel 09/04/2022   Rectal bleeding 08/17/2023   Screening mammogram for breast cancer 09/04/2022   Shoulder pain 08/27/2011   Tick bite 03/08/2012   Vitamin D deficiency    Weight loss     Past Surgical History:  Procedure Laterality Date   COLONOSCOPY  10/10/2020   Dr. Lavon Paganini   ESOPHAGOGASTRODUODENOSCOPY (EGD) WITH PROPOFOL N/A 12/03/2020   Procedure: ESOPHAGOGASTRODUODENOSCOPY (EGD) WITH PROPOFOL;  Surgeon: Napoleon Form, MD;  Location: [facility];  Service: Endoscopy;  Laterality: N/A;   MOHS SURGERY     nose    Family History  Problem Relation Age of Onset   Colitis Mother    Inflammatory bowel disease Mother    Coronary artery disease Mother 84       Stents   Heart attack Mother    Kidney cancer Mother    Lung cancer Father    Arthritis Sister    Cancer Brother        melanoma   Stomach cancer Paternal Aunt    Stomach cancer Paternal Grandmother    Coronary artery disease Paternal Grandfather    Diabetes Neg Hx    Hyperlipidemia Neg Hx    Hypertension Neg Hx    Sudden death Neg Hx    Esophageal cancer Neg Hx    Pancreatic cancer Neg Hx    Liver disease Neg Hx    Colon cancer Neg Hx    Rectal cancer Neg Hx    Colon polyps Neg Hx     Social History   Socioeconomic History   Marital status: Married    Spouse name: Not on file   Number of children: 0    Years of education: Not on file   Highest education level: Not on file  Occupational History   Not on file  Tobacco Use   Smoking status: Former    Current packs/day: 0.00    Types: Cigarettes    Quit date: 09/28/1976    Years since quitting: 47.1   Smokeless tobacco: Never  Vaping Use   Vaping status: Never Used  Substance and Sexual Activity   Alcohol use: Yes    Comment: occ   Drug use: No   Sexual activity:  Not Currently  Other Topics Concern   Not on file  Social History Narrative   2 step daughters and 1 step son   Social Drivers of Corporate investment banker Strain: Low Risk  (09/07/2023)   Overall Financial Resource Strain (CARDIA)    Difficulty of Paying Living Expenses: Not hard at all  Food Insecurity: No Food Insecurity (09/07/2023)   Hunger Vital Sign    Worried About Running Out of Food in the Last Year: Never true    Ran Out of Food in the Last Year: Never true  Transportation Needs: No Transportation Needs (09/07/2023)   PRAPARE - Administrator, Civil Service (Medical): No    Lack of Transportation (Non-Medical): No  Physical Activity: Insufficiently Active (09/07/2023)   Exercise Vital Sign    Days of Exercise per Week: 5 days    Minutes of Exercise per Session: 20 min  Stress: No Stress Concern Present (09/07/2023)   [employer] of Occupational Health - Occupational Stress Questionnaire    Feeling of Stress : Only a little  Social Connections: Socially Integrated (09/07/2023)   Social Connection and Isolation Panel [NHANES]    Frequency of Communication with Friends and Family: Once a week    Frequency of Social Gatherings with Friends and Family: Three times a week    Attends Religious Services: More than 4 times per year    Active Member of Clubs or Organizations: Yes    Attends Banker Meetings: Patient unable to answer    Marital Status: Married  Catering manager Violence: Not At Risk (09/07/2023)   Humiliation,  Afraid, Rape, and Kick questionnaire    Fear of Current or Ex-Partner: No    Emotionally Abused: No    Physically Abused: No    Sexually Abused: No      Review of systems: All other review of systems negative except as mentioned in the HPI.   Physical Exam: Vitals:   11/12/23 1427  BP: 122/80  Pulse: 73   Body mass index is 21.14 kg/m. Gen:      No acute distress HEENT:  sclera anicteric CV: s1s2 rrr, no murmur Lungs: B/l clear. Abd:      soft, non-tender; no palpable masses, no distension Ext:    No edema Neuro: alert and oriented x 3 Psych: normal mood and affect  Data Reviewed:  Reviewed labs, radiology imaging, old records and pertinent past GI work up     Assessment and Plan    Colorectal Polyp (Precancerous) A precancerous polyp was removed from the rectum, causing previous pressure and discomfort. She is now at higher risk for colorectal cancer. Removal decreases cancer progression risk. Follow-up colonoscopies are necessary to monitor for recurrence. If clear in a year, subsequent colonoscopies will be at three-year and five-year intervals. - Schedule follow-up colonoscopy in January 2026 - Monitor for symptoms such as pain or bleeding and return if she occurs  Hemorrhoids Asymptomatic hemorrhoids present. Treatment is only necessary if symptoms like bleeding, swelling, or discomfort develop. Banding removes excess tissue but does not eliminate hemorrhoids. - No treatment required unless symptoms develop  Pharyngeal Achalasia Tight upper esophageal sphincter limiting food intake. ENT specialist recommended a barium swallow test. Some improvement in GERD symptoms noted post-colonoscopy. Less invasive treatments like dilation are preferable to more invasive options. - Perform barium swallow test - Consider less invasive treatments such as dilation if necessary - Monitor symptoms and adjust treatment as needed  Gastroesophageal Reflux Disease (GERD)  GERD  with a small hiatal hernia. Symptoms have improved, and medication dosage has been reduced. Emphasized using the least amount of medication needed to control symptoms. - Continue current GERD management - Adjust medication dosage based on symptoms  Follow-up - Schedule follow-up appointment in six months - Return earlier if symptoms worsen or new symptoms develop.       The patient was provided an opportunity to ask questions and all were answered. The patient agreed with the plan and demonstrated an understanding of the instructions.  Iona Beard , MD    CC: Early, Sung Amabile, NP

## 2023-11-15 ENCOUNTER — Ambulatory Visit: Payer: Medicare Other | Admitting: Podiatry

## 2023-11-18 ENCOUNTER — Encounter: Payer: Self-pay | Admitting: Podiatry

## 2023-11-18 ENCOUNTER — Ambulatory Visit (INDEPENDENT_AMBULATORY_CARE_PROVIDER_SITE_OTHER): Payer: Medicare Other | Admitting: Podiatry

## 2023-11-18 DIAGNOSIS — L600 Ingrowing nail: Secondary | ICD-10-CM | POA: Diagnosis not present

## 2023-11-18 NOTE — Progress Notes (Signed)
 Subjective: Chief Complaint  Patient presents with   Routine Post Op    RM#11 Follow up on bilateral ingrown removal completed course of antibiotics and toes are still red patient states.   68 year old female presents the office with above concerns.  She states that she was having nails checked that she still gets red at times.  There are some mild sensitivity to the left lateral nail border but she is wearing regular shoes and no drainage she is trying to get some symptomatic as well as the right lateral nail border which did not have the procedure preformed.   Objective: AAO x3, NAD DP/PT pulses palpable bilaterally, CRT less than 3 seconds Status post partial nail avulsions.  On the left hallux there is some faint erythema, inflammation noted but there is no cellulitis or signs of infection.  There is no pain on exam.  No drainage or pus.  Not able to appreciate any incurvation of the nail.  There is no indication the right lateral nail border some about edema but there is no drainage or pus. No pain with calf compression, swelling, warmth, erythema  Assessment: Ingrown toenail  Plan: -All treatment options discussed with the patient including all alternatives, risks, complications.  -There is some minimal inflammation but is no signs of infection today.  There is no residual or recurrent ingrowing of the nail where the procedures were performed.  Still soaking Epsom salts.  Monitoring signs or symptoms of infection, recurrent ingrown toenail.  The right lateral nail border I did sharply debride the corner without any complications or bleeding.  Should symptoms persist may need to proceed with partial nail avulsion.  Will continue to monitor for now. -Patient encouraged to call the office with any questions, concerns, change in symptoms.   Vivi Barrack DPM

## 2023-11-22 ENCOUNTER — Encounter: Payer: Self-pay | Admitting: Gastroenterology

## 2024-01-24 ENCOUNTER — Other Ambulatory Visit: Payer: Self-pay | Admitting: Nurse Practitioner

## 2024-01-24 DIAGNOSIS — K2100 Gastro-esophageal reflux disease with esophagitis, without bleeding: Secondary | ICD-10-CM

## 2024-01-26 ENCOUNTER — Other Ambulatory Visit: Payer: Self-pay | Admitting: Nurse Practitioner

## 2024-01-26 ENCOUNTER — Other Ambulatory Visit (HOSPITAL_BASED_OUTPATIENT_CLINIC_OR_DEPARTMENT_OTHER): Payer: Self-pay | Admitting: Nurse Practitioner

## 2024-01-26 DIAGNOSIS — K2100 Gastro-esophageal reflux disease with esophagitis, without bleeding: Secondary | ICD-10-CM

## 2024-01-26 DIAGNOSIS — K219 Gastro-esophageal reflux disease without esophagitis: Secondary | ICD-10-CM

## 2024-01-26 DIAGNOSIS — R1319 Other dysphagia: Secondary | ICD-10-CM

## 2024-01-26 NOTE — Telephone Encounter (Signed)
 Copied from CRM [id number]. Topic: Clinical - Medication Refill >> Jan 26, 2024  2:23 PM Carlatta H wrote: Most Recent Primary Care Visit:  Provider: Claudene Crystal  Department: [facility]  Visit Type: ACUTE 30  Date: 11/03/2023  Medication: pantoprazole  (PROTONIX ) 40 MG tablet [[id number]]  Has the patient contacted their pharmacy? No (Agent: If no, request that the patient contact the pharmacy for the refill. If patient does not wish to contact the pharmacy document the reason why and proceed with request.) (Agent: If yes, when and what did the pharmacy advise?)  Is this the correct pharmacy for this prescription? Yes If no, delete pharmacy and type the correct one.  This is the patient's preferred pharmacy:    [facility] [place], [place] - [address] [place] [address] Phone: [phone] Fax: [phone]    Has the prescription been filled recently? No  Is the patient out of the medication? Yes  Has the patient been seen for an appointment in the last year OR does the patient have an upcoming appointment? Yes  Can we respond through MyChart? Yes  Agent: Please be advised that Rx refills may take up to 3 business days. We ask that you follow-up with your pharmacy.

## 2024-01-28 ENCOUNTER — Other Ambulatory Visit: Payer: Self-pay

## 2024-01-28 ENCOUNTER — Telehealth: Payer: Self-pay

## 2024-01-28 ENCOUNTER — Other Ambulatory Visit: Payer: Self-pay | Admitting: Physician Assistant

## 2024-01-28 DIAGNOSIS — K2100 Gastro-esophageal reflux disease with esophagitis, without bleeding: Secondary | ICD-10-CM

## 2024-01-28 MED ORDER — PANTOPRAZOLE SODIUM 20 MG PO TBEC: 20.0000 mg | DELAYED_RELEASE_TABLET | Freq: Every day | ORAL | 3 refills | Status: DC

## 2024-01-28 NOTE — Telephone Encounter (Signed)
 Please call the prescription for the 20 mg to  [facility] [place], [place] - [address] [place] Phone: [phone]  Fax: [phone]     When possible thank you from the patient

## 2024-01-28 NOTE — Telephone Encounter (Signed)
 I have denied her request for 20 mg pantoprazole  because it is not in her chart anymore. I have that her GI doctor put her on 40 mg last Oct. And her GI d/c the 20 mg. Did you want to change her dose to 20 mg or is she confused about that.   Copied from CRM [id number]. Topic: Clinical - Prescription Issue >> Jan 28, 2024 10:46 AM Oddis Bench wrote: Reason for CRM: patient is calling about pantoprazole  (PROTONIX ) 20 MG tablet, informed the patient that it was prescribed by the GI and they would need to prescribe her a lower dose. She is stating that she was prescribed the lower 20 by Dr. Shirley Douglas

## 2024-02-18 ENCOUNTER — Ambulatory Visit: Admitting: Podiatry

## 2024-03-09 ENCOUNTER — Ambulatory Visit: Admitting: Podiatry

## 2024-04-14 ENCOUNTER — Ambulatory Visit: Admitting: Podiatry

## 2024-04-14 DIAGNOSIS — L603 Nail dystrophy: Secondary | ICD-10-CM

## 2024-04-14 DIAGNOSIS — L600 Ingrowing nail: Secondary | ICD-10-CM | POA: Diagnosis not present

## 2024-04-14 NOTE — Patient Instructions (Signed)
 Ingrown Toenail  An ingrown toenail occurs when the corner or sides of a toenail grow into the surrounding skin. This causes discomfort and pain. The big toe is most commonly affected, but any of the toes can be affected. If an ingrown toenail is not treated, it can become infected. What are the causes? This condition may be caused by: Wearing shoes that are too small or tight. An injury, such as stubbing your toe or having your toe stepped on. Improper cutting or care of your toenails. Having nail or foot abnormalities that were present from birth (congenital abnormalities), such as having a nail that is too big for your toe. What increases the risk? The following factors may make you more likely to develop ingrown toenails: Age. Nails tend to get thicker with age, so ingrown nails are more common among older people. Cutting your toenails incorrectly, such as cutting them very short or cutting them unevenly. An ingrown toenail is more likely to get infected if you have: Diabetes. Blood flow (circulation) problems. What are the signs or symptoms? Symptoms of an ingrown toenail may include: Pain, soreness, or tenderness. Redness. Swelling. Hardening of the skin that surrounds the toenail. Signs that an ingrown toenail may be infected include: Fluid or pus. Symptoms that get worse. How is this diagnosed? Ingrown toenails may be diagnosed based on: Your symptoms and medical history. A physical exam. Labs or tests. If you have fluid or blood coming from your toenail, a sample may be collected to test for the specific type of bacteria that is causing the infection. How is this treated? Treatment depends on the severity of your symptoms. You may be able to care for your toenail at home. If you have an infection, you may be prescribed antibiotic medicines. If you have fluid or pus draining from your toenail, your health care provider may drain it. If you have trouble walking, you may be  given crutches to use. If you have a severe or infected ingrown toenail, you may need a procedure to remove part or all of the nail. Follow these instructions at home: Foot care  Check your wound every day for signs of infection, or as often as told by your health care provider. Check for: More redness, swelling, or pain. More fluid or blood. Warmth. Pus or a bad smell. Do not pick at your toenail or try to remove it yourself. Soak your foot in warm, soapy water. Do this for 20 minutes, 3 times a day, or as often as told by your health care provider. This helps to keep your toe clean and your skin soft. Wear shoes that fit well and are not too tight. Your health care provider may recommend that you wear open-toed shoes while you heal. Trim your toenails regularly and carefully. Cut your toenails straight across to prevent injury to the skin at the corners of the toenail. Do not cut your nails in a curved shape. Keep your feet clean and dry to help prevent infection. General instructions Take over-the-counter and prescription medicines only as told by your health care provider. If you were prescribed an antibiotic, take it as told by your health care provider. Do not stop taking the antibiotic even if you start to feel better. If your health care provider told you to use crutches to help you move around, use them as instructed. Return to your normal activities as told by your health care provider. Ask your health care provider what activities are safe for you.  Keep all follow-up visits. This is important. Contact a health care provider if: You have more redness, swelling, pain, or other symptoms that do not improve with treatment. You have fluid, blood, or pus coming from your toenail. You have a red streak on your skin that starts at your foot and spreads up your leg. You have a fever. Summary An ingrown toenail occurs when the corner or sides of a toenail grow into the surrounding skin.  This causes discomfort and pain. The big toe is most commonly affected, but any of the toes can be affected. If an ingrown toenail is not treated, it can become infected. Fluid or pus draining from your toenail is a sign of infection. Your health care provider may need to drain it. You may be given antibiotics to treat the infection. Trimming your toenails regularly and properly can help you prevent an ingrown toenail. This information is not intended to replace advice given to you by your health care provider. Make sure you discuss any questions you have with your health care provider. Document Revised: 01/14/2021 Document Reviewed: 01/14/2021 Elsevier Patient Education  2024 [employer].

## 2024-04-14 NOTE — Progress Notes (Signed)
 Subjective: Chief Complaint  Patient presents with   Ingrown Toenail    Patient is here for ingrown F/U small growth doing well overall   68 year old female presents the office today with above concerns.  She states the right big toenail was hurting which made the appointment but is no longer hurting now.  She states that it does feel the nail is growing out on both big toes.  Denies any swelling or redness or any drainage.  Objective: AAO x3, NAD DP/PT pulses palpable bilaterally, CRT less than 3 seconds Transverse ridging noted on bilateral hallux toenails in the distal one third of the nail slightly loose on the medial nail borders.  Mild incurvation present right hallux toenail distally there is no pain today to the nails there is no edema, area edema or any signs of infection. No pain with calf compression, swelling, warmth, erythema  Assessment: Ingrown toenails, onychodystrophy  Plan: -All treatment options discussed with the patient including all alternatives, risks, complications.  -As the right hallux toenail is no longer painful we held off on any procedure. Today I sharply debrided the nails without any complications or bleeding.  Does admit the nails are growing out and will continue to monitor.   Return if symptoms worsen or fail to improve.  Jaclyn Spencer DPM  -Patient encouraged to call the office with any questions, concerns, change in symptoms.

## 2024-04-20 ENCOUNTER — Ambulatory Visit: Admitting: Podiatry

## 2024-04-27 ENCOUNTER — Ambulatory Visit: Payer: Self-pay

## 2024-04-27 NOTE — Telephone Encounter (Signed)
 FYI Only or Action Required?: FYI only for provider.  Patient was last seen in primary care on 11/03/2023 by Bulah Alm RAMAN, PA-C.  Called Nurse Triage reporting Rectal Pain.  Symptoms began about a month ago.  Symptoms are: unchanged.  Triage Disposition: See PCP When Office is Open (Within 3 Days)  Patient/caregiver understands and will follow disposition?: Yes      Copied from CRM #[id number]. Topic: Clinical - Red Word Triage >> Apr 27, 2024  5:01 PM Delon HERO wrote: Red Word that prompted transfer to Nurse Triage: Patient is calling to report pain & rawness in her rectum. No blood.      Reason for Disposition  [1] Home treatment > 3 days for rectal pain AND [2] not improved  Answer Assessment - Initial Assessment Questions 1. SYMPTOM:  What's the main symptom you're concerned about? (e.g., pain, itching, swelling, rash)     Rectal pain  2. ONSET: When did the pain start?     About a month  3. RECTAL PAIN: Do you have any pain around your rectum? How bad is the pain?  (Scale 0-10; or none, mild, moderate, severe)     Mild  4. RECTAL ITCHING: Do you have any itching in this area? How bad is the itching?  (Scale 0-10; or none, mild, moderate, severe)     Mild  5. CONSTIPATION: Do you have constipation? If Yes, ask: How often do you have a bowel movement (BM)?  (Normal range: 3 times a day to every 3 days)  When was your last BM?       No 6. CAUSE: What do you think is causing the anus symptoms?     Unsure  7. OTHER SYMPTOMS: Do you have any other symptoms?  (e.g., abdomen pain, fever, rectal bleeding, vomiting)     No  Protocols used: Rectal Symptoms-A-AH

## 2024-05-01 ENCOUNTER — Ambulatory Visit (INDEPENDENT_AMBULATORY_CARE_PROVIDER_SITE_OTHER): Admitting: Medical

## 2024-05-01 VITALS — BP 110/70 | HR 80 | Wt 125.0 lb

## 2024-05-01 DIAGNOSIS — Z8719 Personal history of other diseases of the digestive system: Secondary | ICD-10-CM | POA: Diagnosis not present

## 2024-05-01 DIAGNOSIS — K644 Residual hemorrhoidal skin tags: Secondary | ICD-10-CM

## 2024-05-01 DIAGNOSIS — K6289 Other specified diseases of anus and rectum: Secondary | ICD-10-CM | POA: Diagnosis not present

## 2024-05-01 MED ORDER — HYDROCORTISONE ACETATE 25 MG RE SUPP
25.0000 mg | Freq: Two times a day (BID) | RECTAL | 1 refills | Status: AC
Start: 2024-05-01 — End: ?

## 2024-05-01 MED ORDER — HYDROCORTISONE 2.5 % EX CREA
TOPICAL_CREAM | Freq: Two times a day (BID) | CUTANEOUS | 1 refills | Status: AC
Start: 2024-05-01 — End: ?

## 2024-05-01 NOTE — Progress Notes (Signed)
 Subjective:  Jaclyn Spencer is a 68 y.o. female who presents for Chief Complaint  Patient presents with   Acute Visit    Rectal pain x 1 month. No bleeding, no pain when going to the bathroom. Just feels raw and pain when sitting     Having some issues with pain in rectum.  Had anal fissure most of last year.   Most of last year dealt with pain, irritations, and used various treatments including diltiazem rectal, sitz baths all the time, NTG cream at times.  Had colonsocpoy end of last year, and they found a large rectal polyp.  After that was removed, things resolved with fissure and pain.  Since a few months ago having things get irritated again.  Currently not a lot of pain wiping.  Feels a raw burning pain at anus.  No blood in stool to speak of.    Has daily BM, typicaloy once daily.  Doing a good amount of fiber.  No pain with defecating.   No pus of drainage.  No urinary c/o.  Denies signifnicat constipation.  She is prone to loose bowels.  Uses imodium  occaionally  Used diltiazem 2% and NTG gel last year.   [place] PCP switched her to nifedipine  0.3% ointment instead of nitroglycerin  No belly pain.    Still on protonix , but using pepcid  to try to come off Protonix   No other aggravating or relieving factors.    No other c/o.  Past Medical History:  Diagnosis Date   Abdominal pain    Abdominal pain, left upper quadrant 02/27/2010   Qualifier: Diagnosis of  By: Obie MD, Princella HERO    Abdominal pain, right lower quadrant 05/29/2010   Qualifier: Diagnosis of  By: Mahlon MD, Comer     Allergy    rhinitis   Anal fissure 09/04/2022   Anxiety    At high risk for tick borne illness 03/06/2022   B12 deficiency    Basal cell carcinoma    Cataract    CHEST PAIN 05/29/2010   Qualifier: Diagnosis of  By: Mahlon MD, Katherine     COVID-19 10/05/2022   Diverticulosis    Encounter for screening colonoscopy 07/16/2016   Epigastric  pain 08/27/2011   Eustachian tube dysfunction 03/08/2012   Gastroenteritis 11/19/2022   General medical examination 08/27/2011   GERD (gastroesophageal reflux disease)    Insomnia 07/16/2016   Left-sided muscle weakness 07/08/2021   LLQ abdominal pain 09/14/2022   Loss of weight 02/27/2010   Qualifier: Diagnosis of   By: Obie MD, Princella HERO      Nausea 03/06/2022   Need for shingles vaccine 07/08/2021   Need for vaccination against Streptococcus pneumoniae 09/04/2022   Numbness and tingling 08/27/2011   Pain of right heel 09/04/2022   Rectal bleeding 08/17/2023   Screening mammogram for breast cancer 09/04/2022   Shoulder pain 08/27/2011   Tick bite 03/08/2012   Vitamin D  deficiency    Weight loss    Current Outpatient Medications on File Prior to Visit  Medication Sig Dispense Refill   ALPRAZolam  (XANAX ) 0.25 MG tablet Take 1 tablet (0.25 mg total) by mouth 2 (two) times daily as needed for anxiety. 30 tablet 2   Azelastine  HCl 137 MCG/SPRAY SOLN Place 137 mcg into the nose as needed.     estradiol  (ESTRACE ) 0.1 MG/GM vaginal cream 1 gram vaginally daily for one week, then 1 gram vaginally twice weekly 42.5 g 3  loperamide  (IMODIUM  A-D) 2 MG tablet Take 2 tablets at the first sign of diarrhea and 1 additional tablet with each additional stool. Max 8 pills in 24 hours. 30 tablet 0   loratadine  (CLARITIN ) 10 MG tablet Take 1 tablet (10 mg total) by mouth daily. 30 tablet 11   meloxicam  (MOBIC ) 7.5 MG tablet Take 1 tablet (7.5 mg total) by mouth daily. 30 tablet 0   Multiple Vitamins-Minerals (MULTIVITAMIN WITH MINERALS) tablet Take 1 tablet by mouth 3 (three) times a week.     Tavaborole  (KERYDIN ) 5 % SOLN Apply 1 drop topically daily. Apply 1 drop to the toenail daily. 10 mL 2   Vitamin D , Ergocalciferol , (DRISDOL ) 1.25 MG (50000 UNIT) CAPS capsule Take 1 capsule (50,000 Units total) by mouth every 7 (seven) days. 12 capsule 3   AMBULATORY NON FORMULARY MEDICATION Medication Name: Use  pea sized amount of Nitroglycerin 0.125% pea sized amount per rectum three times a day for 8 weeks 30 g 1   pantoprazole  (PROTONIX ) 20 MG tablet Take 1 tablet (20 mg total) by mouth daily. (Patient not taking: Reported on 05/01/2024) 90 tablet 3   pantoprazole  (PROTONIX ) 40 MG tablet TAKE ONE TABLET BY MOUTH ONE TIME DAILY (Patient not taking: Reported on 05/01/2024) 90 tablet 3   No current facility-administered medications on file prior to visit.     The following portions of the patient's history were reviewed and updated as appropriate: allergies, current medications, past family history, past medical history, past social history, past surgical history and problem list.  ROS Otherwise as in subjective above  Objective: BP 110/70   Pulse 80   Wt 125 lb (56.7 kg)   BMI 20.18 kg/m   General appearance: alert, no distress, well developed, well nourished Abdomen: +bs, soft, non tender, non distended, no masses, no hepatomegaly, no splenomegaly Anus: external small hemorrhoids palpated and nontender, no thrombosed hemorrhoid, no fissure Exam chaperoned by nurse   Assessment: Encounter Diagnoses  Name Primary?   Rectal pain Yes   History of anal fissures    External hemorrhoid      Plan: Rectal pain, external hemorrhoids  We discussed exam findings and symptoms and diagnosis of hemorrhoids.  Discussed possible causes of hemorrhoids including constipation, straining, diarrhea, pregnancy, obesity, heavy lifting, and sitting for long periods.  Discussed prevention, treatment for acute flare ups, and possible complications or worsening symptoms.  Discussed hygiene, SITZ baths, diet and exercise. Rx for Hydrocortisone  2.5% cream today.  You can apply this topically to the external hemorrhoid twice daily short term for 3-5 days at a time as needed for itching, irritations, or swelling Rx for Hydrocortisone  suppository (Anusol  or Proctosol brand) today. You can use this twice daily per  rectum short term for 3-5 days at a time for hemorrhoid flare up including itching, irritations, swelling or discomfort Continue good water and fiber intake. Consider Restora probiotic If onigoing problems and not improivng, follow up with Dr. Shila, GI   Diana was seen today for acute visit.  Diagnoses and all orders for this visit:  Rectal pain  History of anal fissures  External hemorrhoid  Other orders -     hydrocortisone  2.5 % cream; Apply topically 2 (two) times daily. -     hydrocortisone  (ANUSOL -HC) 25 MG suppository; Place 1 suppository (25 mg total) rectally 2 (two) times daily.    Follow up: prn

## 2024-05-08 ENCOUNTER — Telehealth: Payer: Self-pay | Admitting: Podiatry

## 2024-05-08 NOTE — Telephone Encounter (Signed)
 Called and left message to confirm appointment cancellation.

## 2024-05-15 ENCOUNTER — Ambulatory Visit: Admitting: Podiatry

## 2024-07-11 ENCOUNTER — Encounter: Payer: Self-pay | Admitting: Gastroenterology

## 2024-09-08 NOTE — Progress Notes (Unsigned)
 09/11/2024   Vitals:  BP 110/70   Pulse 65   Ht 5' 6 (1.676 m)   Wt 130 lb 6.4 oz (59.1 kg)   SpO2 99%   BMI 21.05 kg/m   Body mass index is 21.05 kg/m. Jaclyn Spencer is a 68 y.o. female who presents for Subsequent Medicare Annual Wellness Exam  Care Team Members: Current Providers as of 09/11/2024 PCP: Oris Camie BRAVO, NP Encounter Provider: Oris Camie BRAVO, NP, starting on Mon Sep 11, 2024 12:00 AM Referring Provider: Oris Camie BRAVO, NP, starting on Mon Sep 11, 2024 12:00 AM Attending Provider: Oris Camie BRAVO, NP, starting on Tue Sep 07, 2023  9:36 AM (Active)   Method of visit:  in person In the event virtual visit conducted, the patient consented to a virtual visit. Patient consented to have virtual visit and was identified by two identifiers.  Encounter participants: Patient: Jaclyn Spencer - located AW[facility] Patient Visit Location: In Office Nurse/Provider: Camie CHARLENA Oris - located Virtual Visit Location Provider: Office/Clinic Others (if applicable): patient only  HPI: History of Present Illness Jaclyn Spencer is a 68 year old female who presents for a medical wellness visit with a med check.  She experiences ongoing reflux symptoms with pain occurring after eating, located at the opening of the stomach. She takes Pepcid  daily, occasionally skipping a day, and finds it helpful. An endoscopy and colonoscopy are scheduled for the end of March. She eats three meals a day, including fruits and vegetables.  She has sleep disturbances, waking frequently due to pressure points causing discomfort. She is considering using aspirin or Advil at night but is concerned about worsening her stomach issues. She has tried magnesium in the past and is considering using it again to aid sleep.  She reports bladder control issues, including urgency and not reaching the bathroom in time. She had an anal rectal test in October and was told that lower pelvic therapy might be beneficial. She is waiting for a  referral for pelvic physical therapy.  She mentions a history of a significant polyp found during a colonoscopy in January of the previous year, which was initially dismissed by her previous provider. She has experienced bleeding and an anal fissure that did not heal.  She is currently using estradiol , which she finds helpful, and is considering the need for a refill.  No vaginal bleeding. She denies difficulty with dressing or bathing. She denies noticing any new lumps, bumps, freckles, or moles. She reports occasional dry eyes and uses drops for relief. She does not regularly walk or climb stairs and has no difficulty concentrating, remembering, or making decisions. She has no falls at home. She has loose rugs with pads underneath and railings on steps. She has grab bars and non-skid surfaces in the bathroom.  Review of Systems:  Neuro: Denies difficulty remembering daily tasks, people, or places.  Ear: Denies difficulty hearing or need to increase volume on television or telephone to hear Eye: Denies visual changes, difficulty reading normal print, or visual field deficits. Cardiac: Denies chest pain, palpitations, dizziness, shortness of breath, pain in lower extremities, or night time waking with shortness of breath. Lung: Denies shortness of breath, difficulty breathing, chronic cough, or dizziness.  GI: Denies changes in bowel habits, blood in stool, difficulty passing stool, decreased intake of food or drink, nausea, or vomiting.  GU: Denies changes in urinary habits, dark urine, malodorous urine, increased or decreased urination, or urinary incontinence.  MSK: Denies weakness in extremities, difficulty  walking, difficulty grasping, or new MSK pain.  Skin: Denies changes to the skin, fragile skin, or increased bruising.  Constitution: Denies fatigue, weakness, or confusion.   Patient rating of health: same as this time last year  Activities of Daily Living (to include  ambulation/medication): Independent Ambulation: Independent Medication Administration: Independent Home Management (perform basic housework or laundry): Independent  Interpreter Needed?: No     09/11/2024    8:24 AM 09/07/2023    8:28 AM 07/24/2021    8:49 AM 12/03/2020    7:09 AM 10/10/2020    7:53 AM  Advanced Directives  Does Patient Have a Medical Advance Directive? No No No No No  Would patient like information on creating a medical advance directive? No - Patient declined No - Patient declined No - Patient declined No - Patient declined No - Patient declined    Social Determinants of Health SDOH Screenings   Food Insecurity: No Food Insecurity (09/11/2024)  Housing: Unknown (09/11/2024)  Transportation Needs: No Transportation Needs (09/11/2024)  Utilities: Not At Risk (09/11/2024)  Depression (PHQ2-9): Low Risk (09/11/2024)  Financial Resource Strain: Low Risk (09/07/2023)  Physical Activity: Insufficiently Active (09/07/2023)  Social Connections: Socially Integrated (09/11/2024)  Stress: No Stress Concern Present (09/11/2024)  Tobacco Use: Medium Risk (09/11/2024)     Functional Status Survey: Is the patient deaf or have difficulty hearing?: No Does the patient have difficulty seeing, even when wearing glasses/contacts?: Yes (sometimes, feels like this is dry eye. Uses drops) Does the patient have difficulty concentrating, remembering, or making decisions?: No Does the patient have difficulty walking or climbing stairs?: No Does the patient have difficulty dressing or bathing?: No Does the patient have difficulty doing errands alone such as visiting a doctor's office or shopping?: No   Annual Goal:  Goals       Patient Stated (pt-stated)      Improve leg strength.       Patient Stated (pt-stated)      Improved strength to prevent falls.          Advanced Directives <no information>     Fall Risk    09/11/2024    8:24 AM 09/07/2023    8:27 AM  09/04/2022    8:35 AM 03/05/2022   11:08 AM 08/14/2021    5:51 PM  Fall Risk   Falls in the past year? 0 0 0 0 0  Number falls in past yr: 0 0 0 0 0  Injury with Fall? 0 0  0  0  0   Risk for fall due to : No Fall Risks No Fall Risks No Fall Risks No Fall Risks No Fall Risks  Follow up Falls evaluation completed Falls evaluation completed Falls evaluation completed  Falls evaluation completed;Education provided  Falls evaluation completed;Education provided      Data saved with a previous flowsheet row definition     Cognitive Function Normal: Yes Exam Completed:     Depression Screening    09/11/2024    8:24 AM 09/07/2023    8:27 AM 09/04/2022    8:35 AM 03/05/2022   11:09 AM 08/14/2021    5:52 PM  Depression screen PHQ 2/9  Decreased Interest 0 0 0 0 1  Down, Depressed, Hopeless 0 0 0 0 0  PHQ - 2 Score 0 0 0 0 1  Altered sleeping     1  Tired, decreased energy     1  Change in appetite     0  Feeling bad  or failure about yourself      0  Trouble concentrating     0  Moving slowly or fidgety/restless     0  Suicidal thoughts     0  PHQ-9 Score     3   Difficult doing work/chores     Not difficult at all     Data saved with a previous flowsheet row definition     Activities of Daily Living    09/11/2024    8:52 AM  In your present state of health, do you have any difficulty performing the following activities:  Hearing? 0  Vision? 1  Comment sometimes, feels like this is dry eye. Uses drops  Difficulty concentrating or making decisions? 0  Walking or climbing stairs? 0  Dressing or bathing? 0  Doing errands, shopping? 0    Tobacco Tobacco Use History[1]   Counseling given: Not Answered   Hospitalizations in the Past Year: none  ED Visits in the Past Year: none  Surgeries in the Past Year: No   History    Medication List Active Medications[2]   Immunizations Immunization History  Administered Date(s) Administered   PFIZER(Purple  Top)SARS-COV-2 Vaccination 11/10/2019, 12/03/2019   PNEUMOCOCCAL CONJUGATE-20 09/04/2022   Tdap 04/16/2021     Screening Tests Health Maintenance  Topic Date Due   Medicare Annual Wellness (AWV)  09/06/2024   Colonoscopy  10/11/2024   COVID-19 Vaccine (3 - Pfizer risk series) 09/27/2024 (Originally 12/31/2019)   Zoster Vaccines- Shingrix  (1 of 2) 12/10/2024 (Originally [birth year]1975)   Mammogram  10/14/2025   DTaP/Tdap/Td (2 - Td or Tdap) 04/17/2031   Pneumococcal Vaccine: 50+ Years  Completed   Bone Density Scan  Completed   Hepatitis C Screening  Completed   Meningococcal B Vaccine  Aged Out   Influenza Vaccine  Discontinued    Health Maintenance Screenings  Health Maintenance Topics with due status: Overdue     Topic Date Due   Medicare Annual Wellness (AWV) 09/06/2024   Health Maintenance Topics with due status: Due Soon     Topic Date Due   Colonoscopy 10/11/2024     Past Medical History:  Diagnosis Date   Abdominal pain    Abdominal pain, left upper quadrant 02/27/2010   Qualifier: Diagnosis of  By: Obie MD, Princella HERO    Abdominal pain, right lower quadrant 05/29/2010   Qualifier: Diagnosis of  By: Mahlon MD, Katherine     Allergy    rhinitis   Anal fissure 09/04/2022   Anxiety    At high risk for tick borne illness 03/06/2022   B12 deficiency    Basal cell carcinoma    Cataract    CHEST PAIN 05/29/2010   Qualifier: Diagnosis of  By: Mahlon MD, Katherine     COVID-19 10/05/2022   Diverticulosis    Encounter for screening colonoscopy 07/16/2016   Epigastric pain 08/27/2011   Eustachian tube dysfunction 03/08/2012   Gastroenteritis 11/19/2022   General medical examination 08/27/2011   GERD (gastroesophageal reflux disease)    Insomnia 07/16/2016   Left-sided muscle weakness 07/08/2021   LLQ abdominal pain 09/14/2022   Loss of weight 02/27/2010   Qualifier: Diagnosis of   By: Obie MD, Princella HERO      Nausea 03/06/2022   Need for shingles vaccine 07/08/2021    Need for vaccination against Streptococcus pneumoniae 09/04/2022   Numbness and tingling 08/27/2011   Pain of right heel 09/04/2022   Rectal bleeding 08/17/2023   Screening mammogram for breast cancer  09/04/2022   Shoulder pain 08/27/2011   Tick bite 03/08/2012   Vitamin D  deficiency    Weight loss    Past Surgical History:  Procedure Laterality Date   COLONOSCOPY  10/10/2020   Dr. Nandigam   ESOPHAGOGASTRODUODENOSCOPY (EGD) WITH PROPOFOL  N/A 12/03/2020   Procedure: ESOPHAGOGASTRODUODENOSCOPY (EGD) WITH PROPOFOL ;  Surgeon: Shila Gustav GAILS, MD;  Location: [facility];  Service: Endoscopy;  Laterality: N/A;   MOHS SURGERY     nose   Family History  Problem Relation Age of Onset   Colitis Mother    Inflammatory bowel disease Mother    Coronary artery disease Mother 87       Stents   Heart attack Mother    Kidney cancer Mother    Lung cancer Father    Arthritis Sister    Cancer Brother        melanoma   Stomach cancer Paternal Aunt    Stomach cancer Paternal Grandmother    Coronary artery disease Paternal Grandfather    Diabetes Neg Hx    Hyperlipidemia Neg Hx    Hypertension Neg Hx    Sudden death Neg Hx    Esophageal cancer Neg Hx    Pancreatic cancer Neg Hx    Liver disease Neg Hx    Colon cancer Neg Hx    Rectal cancer Neg Hx    Colon polyps Neg Hx    Social History   Socioeconomic History   Marital status: Married    Spouse name: Not on file   Number of children: 0   Years of education: Not on file   Highest education level: Not on file  Occupational History   Not on file  Tobacco Use   Smoking status: Former    Current packs/day: 0.00    Types: Cigarettes    Quit date: 09/28/1976    Years since quitting: 47.9   Smokeless tobacco: Never  Vaping Use   Vaping status: Never Used  Substance and Sexual Activity   Alcohol use: Yes    Comment: occ   Drug use: No   Sexual activity: Not Currently  Other Topics Concern   Not on file  Social History  Narrative   2 step daughters and 1 step son   Social Drivers of Health   Tobacco Use: Medium Risk (09/11/2024)   Patient History    Smoking Tobacco Use: Former    Smokeless Tobacco Use: Never    Passive Exposure: Not on Actuary Strain: Low Risk (09/07/2023)   Overall Financial Resource Strain (CARDIA)    Difficulty of Paying Living Expenses: Not hard at all  Food Insecurity: No Food Insecurity (09/11/2024)   Epic    Worried About Radiation Protection Practitioner of Food in the Last Year: Never true    Ran Out of Food in the Last Year: Never true  Transportation Needs: No Transportation Needs (09/11/2024)   Epic    Lack of Transportation (Medical): No    Lack of Transportation (Non-Medical): No  Physical Activity: Insufficiently Active (09/07/2023)   Exercise Vital Sign    Days of Exercise per Week: 5 days    Minutes of Exercise per Session: 20 min  Stress: No Stress Concern Present (09/11/2024)   [employer] of Occupational Health - Occupational Stress Questionnaire    Feeling of Stress: Only a little  Social Connections: Socially Integrated (09/11/2024)   Social Connection and Isolation Panel    Frequency of Communication with Friends and Family: Once a week  Frequency of Social Gatherings with Friends and Family: Three times a week    Attends Religious Services: More than 4 times per year    Active Member of Clubs or Organizations: Yes    Attends Banker Meetings: More than 4 times per year    Marital Status: Married  Depression (PHQ2-9): Low Risk (09/11/2024)   Depression (PHQ2-9)    PHQ-2 Score: 0  Alcohol Screen: Not on file  Housing: Unknown (09/11/2024)   Epic    Unable to Pay for Housing in the Last Year: No    Number of Times Moved in the Last Year: Not on file    Homeless in the Last Year: No  Utilities: Not At Risk (09/11/2024)   Epic    Threatened with loss of utilities: No  Health Literacy: Not on file    Outpatient Encounter  Medications as of 09/11/2024  Medication Sig   AMBULATORY NON FORMULARY MEDICATION Medication Name: Use pea sized amount of Nitroglycerin 0.125% pea sized amount per rectum three times a day for 8 weeks   estradiol  (ESTRACE ) 0.01 % CREA vaginal cream Place 1 Applicatorful vaginally once a week.   Famotidine  (PEPCID  PO) Take by mouth.   hydrocortisone  (ANUSOL -HC) 25 MG suppository Place 1 suppository (25 mg total) rectally 2 (two) times daily.   loperamide  (IMODIUM  A-D) 2 MG tablet Take 2 tablets at the first sign of diarrhea and 1 additional tablet with each additional stool. Max 8 pills in 24 hours.   loratadine  (CLARITIN ) 10 MG tablet Take 1 tablet (10 mg total) by mouth daily.   Multiple Vitamins-Minerals (MULTIVITAMIN WITH MINERALS) tablet Take 1 tablet by mouth 3 (three) times a week.   Vitamin D , Ergocalciferol , (DRISDOL ) 1.25 MG (50000 UNIT) CAPS capsule Take 1 capsule (50,000 Units total) by mouth every 7 (seven) days.   [DISCONTINUED] estradiol  (ESTRACE ) 0.1 MG/GM vaginal cream 1 gram vaginally daily for one week, then 1 gram vaginally twice weekly   hydrocortisone  2.5 % cream Apply topically 2 (two) times daily. (Patient not taking: Reported on 09/11/2024)   [DISCONTINUED] Azelastine  HCl 137 MCG/SPRAY SOLN Place 137 mcg into the nose as needed.   [DISCONTINUED] meloxicam  (MOBIC ) 7.5 MG tablet Take 1 tablet (7.5 mg total) by mouth daily. (Patient not taking: Reported on 09/11/2024)   [DISCONTINUED] pantoprazole  (PROTONIX ) 20 MG tablet Take 1 tablet (20 mg total) by mouth daily. (Patient not taking: Reported on 05/01/2024)   [DISCONTINUED] pantoprazole  (PROTONIX ) 40 MG tablet TAKE ONE TABLET BY MOUTH ONE TIME DAILY (Patient not taking: Reported on 05/01/2024)   [DISCONTINUED] Tavaborole  (KERYDIN ) 5 % SOLN Apply 1 drop topically daily. Apply 1 drop to the toenail daily.   No facility-administered encounter medications on file as of 09/11/2024.    Physical Exam:  Physical Exam Vitals and  nursing note reviewed.  Constitutional:      General: She is not in acute distress.    Appearance: Normal appearance.  HENT:     Head: Normocephalic.     Right Ear: Tympanic membrane normal.     Left Ear: Tympanic membrane normal.     Nose: Nose normal.     Mouth/Throat:     Mouth: Mucous membranes are moist.     Pharynx: Oropharynx is clear.  Eyes:     Conjunctiva/sclera: Conjunctivae normal.     Pupils: Pupils are equal, round, and reactive to light.  Neck:     Vascular: No carotid bruit.  Cardiovascular:     Rate and Rhythm: Normal  rate and regular rhythm.     Pulses: Normal pulses.     Heart sounds: Normal heart sounds.  Pulmonary:     Effort: Pulmonary effort is normal.     Breath sounds: Normal breath sounds.  Abdominal:     General: Bowel sounds are normal. There is no distension.     Palpations: Abdomen is soft. There is no mass.     Tenderness: There is abdominal tenderness. There is no right CVA tenderness, left CVA tenderness or guarding.     Comments: Mild epigastric tenderness.   Musculoskeletal:     Cervical back: No tenderness.     Right lower leg: No edema.     Left lower leg: No edema.  Lymphadenopathy:     Cervical: No cervical adenopathy.  Skin:    General: Skin is warm and dry.     Capillary Refill: Capillary refill takes less than 2 seconds.     Comments: 2mm flesh colored papule on the right clavicular area. No drainage, erythema, excoriation present.   Neurological:     General: No focal deficit present.     Mental Status: She is alert and oriented to person, place, and time.     Sensory: No sensory deficit.     Motor: No weakness.     Coordination: Coordination normal.  Psychiatric:        Mood and Affect: Mood normal.     PLAN  Exercise Activities and Dietary Recommendations - choose a type of activity I enjoy such as biking, gardening, team sports, walking - go out for a short walk before breakfast, after dinner or both Regular  diet  Fall Prevention - always use handrails on the stairs - always wear shoes or slippers with non-slip sole - get at least 10 minutes of activity every day - wear low heeled or flat shoes with non-skid soles Assessment & Plan Encounter for Medicare annual wellness exam Routine wellness visit with ongoing health issues discussed. Discussed general health maintenance, including vaccinations and screenings. - Performed blood work to check cholesterol levels - Discussed shingles vaccine availability at pharmacies Vaginal atrophy Managed with estradiol  suppositories, providing significant relief. Discussed need for applicators with each instillation. - Continue estradiol  suppositories once weekly - Requested applicators with each prescription B12 deficiency Labs for monitoring.  Vitamin D  deficiency, unspecified Repeat labs for monitoring. Will make changes as necessary.  Hypercholesterolemia Currently managed with diet and exercise. Will recheck labs today for monitoring. Consider additional management as necessary based on results.  Age-related osteoporosis without current pathological fracture Ongoing focus on maintaining leg strength to prevent falls. Discussed muscle mass loss with aging and importance of maintaining strength. - Continue exercises to maintain leg strength - Monitor for any changes in mobility or strength Urinary urgency Chronic pelvic floor dysfunction with urinary urgency. Previous manometry suggested potential benefit from pelvic physical therapy. Discussed referral options and potential benefits of pelvic PT and urogynecology consultation. Discussed bladder attack as a surgical option and pessary as a non-surgical alternative. - Will send referral for pelvic physical therapy - Will consider referral to urogynecology if pelvic PT is not effective Gastroesophageal reflux disease with esophagitis without hemorrhage Chronic GERD with upper abdominal pain, primarily  postprandial. Currently on Pepcid , which provides some relief. Discussed potential exacerbation with NSAIDs and alternative pain management options. Endoscopy scheduled for further evaluation. Differential includes hernia, ulceration, or H. pylori infection. - Continue Pepcid  once daily, with option to increase to twice daily if symptoms worsen - Consider  Tums or dual-action Pepcid /Tums for breakthrough symptoms - Avoid regular use of NSAIDs to prevent exacerbation - Proceed with scheduled endoscopy for further evaluation   Orders Placed This Encounter  Procedures   CBC with Differential/Platelet    Release to patient:   Immediate   Comprehensive metabolic panel with GFR    Has the patient fasted?:   Yes    Release to patient:   Immediate   Lipid panel    Has the patient fasted?:   Yes    Release to patient:   Immediate   VITAMIN D  25 Hydroxy (Vit-D Deficiency, Fractures)    Release to patient:   Immediate   Vitamin B12     I have personally reviewed and noted the following in the patients chart:   Medical and social history Use of alcohol, tobacco or illicit drugs  Current medications and supplements Functional ability and status Nutritional status Physical activity Advanced directives List of other physicians Hospitalizations, surgeries, and ER visits in previous 12 months Vitals Screenings to include cognitive, depression, and falls Referrals and appointments  In addition, I have reviewed and discussed with patient certain preventive protocols, quality metrics, and best practice recommendations. A written personalized care plan for preventive services as well as general preventive health recommendations were provided to patient.   Camie CHARLENA Doing, NP  09/11/2024        [1]  Social History Tobacco Use  Smoking Status Former   Current packs/day: 0.00   Types: Cigarettes   Quit date: 09/28/1976   Years since quitting: 47.9  Smokeless Tobacco Never  [2]  Current  Meds  Medication Sig   AMBULATORY NON FORMULARY MEDICATION Medication Name: Use pea sized amount of Nitroglycerin 0.125% pea sized amount per rectum three times a day for 8 weeks   estradiol  (ESTRACE ) 0.01 % CREA vaginal cream Place 1 Applicatorful vaginally once a week.   Famotidine  (PEPCID  PO) Take by mouth.   hydrocortisone  (ANUSOL -HC) 25 MG suppository Place 1 suppository (25 mg total) rectally 2 (two) times daily.   loperamide  (IMODIUM  A-D) 2 MG tablet Take 2 tablets at the first sign of diarrhea and 1 additional tablet with each additional stool. Max 8 pills in 24 hours.   loratadine  (CLARITIN ) 10 MG tablet Take 1 tablet (10 mg total) by mouth daily.   Multiple Vitamins-Minerals (MULTIVITAMIN WITH MINERALS) tablet Take 1 tablet by mouth 3 (three) times a week.   Vitamin D , Ergocalciferol , (DRISDOL ) 1.25 MG (50000 UNIT) CAPS capsule Take 1 capsule (50,000 Units total) by mouth every 7 (seven) days.   [DISCONTINUED] estradiol  (ESTRACE ) 0.1 MG/GM vaginal cream 1 gram vaginally daily for one week, then 1 gram vaginally twice weekly

## 2024-09-11 ENCOUNTER — Ambulatory Visit: Payer: Medicare Other | Admitting: Nurse Practitioner

## 2024-09-11 ENCOUNTER — Encounter: Payer: Self-pay | Admitting: Nurse Practitioner

## 2024-09-11 VITALS — BP 110/70 | HR 65 | Ht 66.0 in | Wt 130.4 lb

## 2024-09-11 DIAGNOSIS — K2100 Gastro-esophageal reflux disease with esophagitis, without bleeding: Secondary | ICD-10-CM | POA: Diagnosis not present

## 2024-09-11 DIAGNOSIS — E7800 Pure hypercholesterolemia, unspecified: Secondary | ICD-10-CM

## 2024-09-11 DIAGNOSIS — Z0000 Encounter for general adult medical examination without abnormal findings: Secondary | ICD-10-CM | POA: Diagnosis not present

## 2024-09-11 DIAGNOSIS — R3915 Urgency of urination: Secondary | ICD-10-CM | POA: Diagnosis not present

## 2024-09-11 DIAGNOSIS — N952 Postmenopausal atrophic vaginitis: Secondary | ICD-10-CM

## 2024-09-11 DIAGNOSIS — M810 Age-related osteoporosis without current pathological fracture: Secondary | ICD-10-CM

## 2024-09-11 DIAGNOSIS — E538 Deficiency of other specified B group vitamins: Secondary | ICD-10-CM

## 2024-09-11 DIAGNOSIS — E559 Vitamin D deficiency, unspecified: Secondary | ICD-10-CM

## 2024-09-11 MED ORDER — ESTRADIOL 0.01 % VA CREA: 1.0000 | TOPICAL_CREAM | VAGINAL | 12 refills | Status: AC

## 2024-09-11 NOTE — Assessment & Plan Note (Addendum)
 Routine wellness visit with ongoing health issues discussed. Discussed general health maintenance, including vaccinations and screenings. - Performed blood work to check cholesterol levels - Discussed shingles vaccine availability at pharmacies

## 2024-09-11 NOTE — Assessment & Plan Note (Addendum)
 Repeat labs for monitoring. Will make changes as necessary.

## 2024-09-11 NOTE — Assessment & Plan Note (Addendum)
 Chronic pelvic floor dysfunction with urinary urgency. Previous manometry suggested potential benefit from pelvic physical therapy. Discussed referral options and potential benefits of pelvic PT and urogynecology consultation. Discussed bladder attack as a surgical option and pessary as a non-surgical alternative. - Will send referral for pelvic physical therapy - Will consider referral to urogynecology if pelvic PT is not effective

## 2024-09-11 NOTE — Assessment & Plan Note (Addendum)
Labs for monitoring.

## 2024-09-11 NOTE — Patient Instructions (Addendum)
 You can try Magnesium Glyconate 200-400mg  about 30 minutes before bedtime to help with sleep.  If this is not helpful, please let me know and we can try Trazodone (which is a prescription).   Let me know if you need the referral for pelvic pt and I will be happy to send this in.  We can also send a referral to urogynecology to discuss other options, like the bladder tacking procedure or a pessary, like we discussed.  Please let me know if this is something you are interested in.   You can have the shingles vaccine completed at the pharmacy of your choice. Please ask them to send the records to our office at [phone] (fax).     For all adult patients, I recommend A well balanced diet low in saturated fats, cholesterol, and moderation in carbohydrates.   This can be as simple as monitoring portion sizes and cutting back on sugary beverages such as soda and juice to start with.    Daily water consumption of at least 64 ounces.  Physical activity at least 180 minutes per week, if just starting out.   This can be as simple as taking the stairs instead of the elevator and walking 2-3 laps around the office  purposefully every day.   STD protection, partner selection, and regular testing if high risk.  Limited consumption of alcoholic beverages if alcohol is consumed.  For women, I recommend no more than 7 alcoholic beverages per week, spread out throughout the week.  Avoid binge drinking or consuming large quantities of alcohol in one setting.   Please let me know if you feel you may need help with reduction or quitting alcohol consumption.   Avoidance of nicotine, if used.  Please let me know if you feel you may need help with reduction or quitting nicotine use.   Daily mental health attention.  This can be in the form of 5 minute daily meditation, prayer, journaling, yoga, reflection, etc.   Purposeful attention to your emotions and mental state can significantly improve your  overall wellbeing  and  Health.  Please know that I am here to help you with all of your health care goals and am happy to work with you to find a solution that works best for you.  The greatest advice I have received with any changes in life are to take it one step at a time, that even means if all you can focus on is the next 60 seconds, then do that and celebrate your victories.  With any changes in life, you will have set backs, and that is OK. The important thing to remember is, if you have a set back, it is not a failure, it is an opportunity to try again!  Health Maintenance Recommendations Screening Testing Mammogram Every 1 -2 years based on history and risk factors Starting at age 59 Pap Smear Ages 21-39 every 3 years Ages 15-65 every 5 years with HPV testing More frequent testing may be required based on results and history Colon Cancer Screening Every 1-10 years based on test performed, risk factors, and history Starting at age 85 Bone Density Screening Every 2-10 years based on history Starting at age 16 for women Recommendations for men differ based on medication usage, history, and risk factors AAA Screening One time ultrasound Men 84-108 years old who have every smoked Lung Cancer Screening Low Dose Lung CT every 12 months Age 65-80 years with a 30 pack-year smoking history who still  smoke or who have quit within the last 15 years  Screening Labs Routine  Labs: Complete Blood Count (CBC), Complete Metabolic Panel (CMP), Cholesterol (Lipid Panel) Every 6-12 months based on history and medications May be recommended more frequently based on current conditions or previous results Hemoglobin A1c Lab Every 3-12 months based on history and previous results Starting at age 5 or earlier with diagnosis of diabetes, high cholesterol, BMI >26, and/or risk factors Frequent monitoring for patients with diabetes to ensure blood sugar control Thyroid Panel (TSH w/ T3 &  T4) Every 6 months based on history, symptoms, and risk factors May be repeated more often if on medication HIV One time testing for all patients 54 and older May be repeated more frequently for patients with increased risk factors or exposure Hepatitis C One time testing for all patients 42 and older May be repeated more frequently for patients with increased risk factors or exposure Gonorrhea, Chlamydia Every 12 months for all sexually active persons 13-24 years Additional monitoring may be recommended for those who are considered high risk or who have symptoms PSA Men 2-12 years old with risk factors Additional screening may be recommended from age 78-69 based on risk factors, symptoms, and history  Vaccine Recommendations Tetanus Booster All adults every 10 years Flu Vaccine All patients 6 months and older every year COVID Vaccine All patients 12 years and older Initial dosing with booster May recommend additional booster based on age and health history HPV Vaccine 2 doses all patients age 59-26 Dosing may be considered for patients over 26 Shingles Vaccine (Shingrix ) 2 doses all adults 55 years and older Pneumonia (Pneumovax 23) All adults 65 years and older May recommend earlier dosing based on health history Pneumonia (Prevnar 13) All adults 65 years and older Dosed 1 year after Pneumovax 23  Additional Screening, Testing, and Vaccinations may be recommended on an individualized basis based on family history, health history, risk factors, and/or exposure.

## 2024-09-11 NOTE — Assessment & Plan Note (Addendum)
 Managed with estradiol  suppositories, providing significant relief. Discussed need for applicators with each instillation. - Continue estradiol  suppositories once weekly - Requested applicators with each prescription

## 2024-09-11 NOTE — Assessment & Plan Note (Addendum)
 Ongoing focus on maintaining leg strength to prevent falls. Discussed muscle mass loss with aging and importance of maintaining strength. - Continue exercises to maintain leg strength - Monitor for any changes in mobility or strength

## 2024-09-11 NOTE — Assessment & Plan Note (Addendum)
 Chronic GERD with upper abdominal pain, primarily postprandial. Currently on Pepcid , which provides some relief. Discussed potential exacerbation with NSAIDs and alternative pain management options. Endoscopy scheduled for further evaluation. Differential includes hernia, ulceration, or H. pylori infection. - Continue Pepcid  once daily, with option to increase to twice daily if symptoms worsen - Consider Tums or dual-action Pepcid /Tums for breakthrough symptoms - Avoid regular use of NSAIDs to prevent exacerbation - Proceed with scheduled endoscopy for further evaluation

## 2024-09-11 NOTE — Assessment & Plan Note (Addendum)
 Currently managed with diet and exercise. Will recheck labs today for monitoring. Consider additional management as necessary based on results.

## 2024-09-12 LAB — COMPREHENSIVE METABOLIC PANEL WITH GFR
ALT: 13 IU/L (ref 0–32)
AST: 18 IU/L (ref 0–40)
Albumin: 4.4 g/dL (ref 3.9–4.9)
Alkaline Phosphatase: 48 IU/L — ABNORMAL LOW (ref 49–135)
BUN/Creatinine Ratio: 12 (ref 12–28)
BUN: 7 mg/dL — ABNORMAL LOW (ref 8–27)
Bilirubin Total: 0.6 mg/dL (ref 0.0–1.2)
CO2: 25 mmol/L (ref 20–29)
Calcium: 9.3 mg/dL (ref 8.7–10.3)
Chloride: 102 mmol/L (ref 96–106)
Creatinine, Ser: 0.57 mg/dL (ref 0.57–1.00)
Globulin, Total: 2.3 g/dL (ref 1.5–4.5)
Glucose: 90 mg/dL (ref 70–99)
Potassium: 4.3 mmol/L (ref 3.5–5.2)
Sodium: 140 mmol/L (ref 134–144)
Total Protein: 6.7 g/dL (ref 6.0–8.5)
eGFR: 99 mL/min/1.73 (ref >59)

## 2024-09-12 LAB — LIPID PANEL
Chol/HDL Ratio: 2.1 ratio (ref 0.0–4.4)
Cholesterol, Total: 204 mg/dL — ABNORMAL HIGH (ref 100–199)
HDL: 95 mg/dL (ref >39)
LDL Chol Calc (NIH): 100 mg/dL — ABNORMAL HIGH (ref 0–99)
Triglycerides: 48 mg/dL (ref 0–149)
VLDL Cholesterol Cal: 9 mg/dL (ref 5–40)

## 2024-09-12 LAB — CBC WITH DIFFERENTIAL/PLATELET
Basophils Absolute: 0 x10E3/uL (ref 0.0–0.2)
Basos: 0 %
EOS (ABSOLUTE): 0.1 x10E3/uL (ref 0.0–0.4)
Eos: 1 %
Hematocrit: 39.5 % (ref 34.0–46.6)
Hemoglobin: 13.1 g/dL (ref 11.1–15.9)
Immature Grans (Abs): 0 x10E3/uL (ref 0.0–0.1)
Immature Granulocytes: 0 %
Lymphocytes Absolute: 1.9 x10E3/uL (ref 0.7–3.1)
Lymphs: 28 %
MCH: 32.2 pg (ref 26.6–33.0)
MCHC: 33.2 g/dL (ref 31.5–35.7)
MCV: 97 fL (ref 79–97)
Monocytes Absolute: 0.4 x10E3/uL (ref 0.1–0.9)
Monocytes: 6 %
Neutrophils Absolute: 4.5 x10E3/uL (ref 1.4–7.0)
Neutrophils: 65 %
Platelets: 225 x10E3/uL (ref 150–450)
RBC: 4.07 x10E6/uL (ref 3.77–5.28)
RDW: 12.5 % (ref 11.7–15.4)
WBC: 7 x10E3/uL (ref 3.4–10.8)

## 2024-09-12 LAB — VITAMIN B12: Vitamin B-12: 589 pg/mL (ref 232–1245)

## 2024-09-12 LAB — VITAMIN D 25 HYDROXY (VIT D DEFICIENCY, FRACTURES): Vit D, 25-Hydroxy: 37.9 ng/mL (ref 30.0–100.0)

## 2024-09-14 ENCOUNTER — Other Ambulatory Visit (HOSPITAL_BASED_OUTPATIENT_CLINIC_OR_DEPARTMENT_OTHER): Payer: Self-pay

## 2024-09-14 MED ORDER — SHINGRIX 50 MCG/0.5ML IM SUSR
0.5000 mL | INTRAMUSCULAR | 0 refills | Status: AC
  Filled 2024-09-14: qty 0.5, 1d supply, fill #0

## 2024-09-20 ENCOUNTER — Ambulatory Visit: Payer: Self-pay | Admitting: Nurse Practitioner

## 2025-09-12 ENCOUNTER — Encounter: Admitting: Nurse Practitioner
